# Patient Record
Sex: Male | Born: 1955
Health system: Southern US, Community
[De-identification: ages and names within clinical notes are randomized; demographics above are authoritative.]

## PROBLEM LIST (undated history)

## (undated) DIAGNOSIS — M1990 Unspecified osteoarthritis, unspecified site: Secondary | ICD-10-CM

## (undated) HISTORY — DX: Unspecified osteoarthritis, unspecified site: M19.90

---
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
## 1982-07-07 HISTORY — PX: ANKLE SURGERY: SHX546

## 1993-06-06 DIAGNOSIS — S3992XD Unspecified injury of lower back, subsequent encounter: Secondary | ICD-10-CM | POA: Insufficient documentation

## 1997-10-18 ENCOUNTER — Other Ambulatory Visit: Admission: RE | Admit: 1997-10-18 | Discharge: 1997-10-18 | Payer: Self-pay | Admitting: Dermatology

## 1999-07-04 ENCOUNTER — Ambulatory Visit (HOSPITAL_COMMUNITY): Admission: RE | Admit: 1999-07-04 | Discharge: 1999-07-04 | Payer: Self-pay | Admitting: Surgery

## 2000-06-14 ENCOUNTER — Ambulatory Visit (HOSPITAL_BASED_OUTPATIENT_CLINIC_OR_DEPARTMENT_OTHER): Admission: RE | Admit: 2000-06-14 | Discharge: 2000-06-14 | Payer: Self-pay | Admitting: Otolaryngology

## 2003-04-21 ENCOUNTER — Ambulatory Visit (HOSPITAL_COMMUNITY): Admission: RE | Admit: 2003-04-21 | Discharge: 2003-04-21 | Payer: Self-pay | Admitting: Gastroenterology

## 2016-12-22 DIAGNOSIS — R972 Elevated prostate specific antigen [PSA]: Secondary | ICD-10-CM | POA: Diagnosis not present

## 2016-12-22 DIAGNOSIS — Z0000 Encounter for general adult medical examination without abnormal findings: Secondary | ICD-10-CM | POA: Diagnosis not present

## 2016-12-22 DIAGNOSIS — Z8639 Personal history of other endocrine, nutritional and metabolic disease: Secondary | ICD-10-CM | POA: Diagnosis not present

## 2017-01-21 DIAGNOSIS — K648 Other hemorrhoids: Secondary | ICD-10-CM | POA: Diagnosis not present

## 2017-01-22 DIAGNOSIS — K648 Other hemorrhoids: Secondary | ICD-10-CM | POA: Insufficient documentation

## 2017-02-11 DIAGNOSIS — K648 Other hemorrhoids: Secondary | ICD-10-CM | POA: Diagnosis not present

## 2017-02-12 DIAGNOSIS — M25552 Pain in left hip: Secondary | ICD-10-CM | POA: Diagnosis not present

## 2017-02-19 DIAGNOSIS — M25552 Pain in left hip: Secondary | ICD-10-CM | POA: Diagnosis not present

## 2017-02-26 DIAGNOSIS — M25552 Pain in left hip: Secondary | ICD-10-CM | POA: Diagnosis not present

## 2017-02-26 DIAGNOSIS — S73192A Other sprain of left hip, initial encounter: Secondary | ICD-10-CM | POA: Diagnosis not present

## 2017-03-11 DIAGNOSIS — K648 Other hemorrhoids: Secondary | ICD-10-CM | POA: Diagnosis not present

## 2017-03-26 DIAGNOSIS — H40013 Open angle with borderline findings, low risk, bilateral: Secondary | ICD-10-CM | POA: Diagnosis not present

## 2017-03-31 DIAGNOSIS — M76892 Other specified enthesopathies of left lower limb, excluding foot: Secondary | ICD-10-CM | POA: Diagnosis not present

## 2017-03-31 DIAGNOSIS — M25552 Pain in left hip: Secondary | ICD-10-CM | POA: Diagnosis not present

## 2017-04-01 DIAGNOSIS — Z23 Encounter for immunization: Secondary | ICD-10-CM | POA: Diagnosis not present

## 2017-04-09 DIAGNOSIS — S73192D Other sprain of left hip, subsequent encounter: Secondary | ICD-10-CM | POA: Diagnosis not present

## 2017-04-20 DIAGNOSIS — S73192D Other sprain of left hip, subsequent encounter: Secondary | ICD-10-CM | POA: Diagnosis not present

## 2017-04-22 DIAGNOSIS — S73192D Other sprain of left hip, subsequent encounter: Secondary | ICD-10-CM | POA: Diagnosis not present

## 2017-04-27 DIAGNOSIS — S73192D Other sprain of left hip, subsequent encounter: Secondary | ICD-10-CM | POA: Diagnosis not present

## 2017-04-29 DIAGNOSIS — S73192D Other sprain of left hip, subsequent encounter: Secondary | ICD-10-CM | POA: Diagnosis not present

## 2017-05-04 DIAGNOSIS — S73192D Other sprain of left hip, subsequent encounter: Secondary | ICD-10-CM | POA: Diagnosis not present

## 2017-05-07 DIAGNOSIS — S73192D Other sprain of left hip, subsequent encounter: Secondary | ICD-10-CM | POA: Diagnosis not present

## 2017-05-12 DIAGNOSIS — S73192D Other sprain of left hip, subsequent encounter: Secondary | ICD-10-CM | POA: Diagnosis not present

## 2017-05-14 DIAGNOSIS — S73192D Other sprain of left hip, subsequent encounter: Secondary | ICD-10-CM | POA: Diagnosis not present

## 2017-05-15 DIAGNOSIS — M76892 Other specified enthesopathies of left lower limb, excluding foot: Secondary | ICD-10-CM | POA: Diagnosis not present

## 2017-05-20 DIAGNOSIS — S73192D Other sprain of left hip, subsequent encounter: Secondary | ICD-10-CM | POA: Diagnosis not present

## 2017-05-22 DIAGNOSIS — S73192D Other sprain of left hip, subsequent encounter: Secondary | ICD-10-CM | POA: Diagnosis not present

## 2017-05-26 DIAGNOSIS — D225 Melanocytic nevi of trunk: Secondary | ICD-10-CM | POA: Diagnosis not present

## 2017-05-26 DIAGNOSIS — L918 Other hypertrophic disorders of the skin: Secondary | ICD-10-CM | POA: Diagnosis not present

## 2017-05-26 DIAGNOSIS — L821 Other seborrheic keratosis: Secondary | ICD-10-CM | POA: Diagnosis not present

## 2017-05-26 DIAGNOSIS — D1801 Hemangioma of skin and subcutaneous tissue: Secondary | ICD-10-CM | POA: Diagnosis not present

## 2017-06-03 DIAGNOSIS — S73192D Other sprain of left hip, subsequent encounter: Secondary | ICD-10-CM | POA: Diagnosis not present

## 2017-06-05 DIAGNOSIS — S73192D Other sprain of left hip, subsequent encounter: Secondary | ICD-10-CM | POA: Diagnosis not present

## 2017-06-08 DIAGNOSIS — S73192D Other sprain of left hip, subsequent encounter: Secondary | ICD-10-CM | POA: Diagnosis not present

## 2017-06-10 DIAGNOSIS — S73192D Other sprain of left hip, subsequent encounter: Secondary | ICD-10-CM | POA: Diagnosis not present

## 2017-06-17 DIAGNOSIS — S73192D Other sprain of left hip, subsequent encounter: Secondary | ICD-10-CM | POA: Diagnosis not present

## 2017-06-22 DIAGNOSIS — S73192D Other sprain of left hip, subsequent encounter: Secondary | ICD-10-CM | POA: Diagnosis not present

## 2017-06-23 DIAGNOSIS — K648 Other hemorrhoids: Secondary | ICD-10-CM | POA: Diagnosis not present

## 2017-06-25 DIAGNOSIS — S73192D Other sprain of left hip, subsequent encounter: Secondary | ICD-10-CM | POA: Diagnosis not present

## 2017-07-06 DIAGNOSIS — S73192D Other sprain of left hip, subsequent encounter: Secondary | ICD-10-CM | POA: Diagnosis not present

## 2017-11-09 DIAGNOSIS — M5430 Sciatica, unspecified side: Secondary | ICD-10-CM | POA: Diagnosis not present

## 2017-11-11 DIAGNOSIS — M5136 Other intervertebral disc degeneration, lumbar region: Secondary | ICD-10-CM | POA: Diagnosis not present

## 2017-11-11 DIAGNOSIS — M9903 Segmental and somatic dysfunction of lumbar region: Secondary | ICD-10-CM | POA: Diagnosis not present

## 2017-11-11 DIAGNOSIS — M9905 Segmental and somatic dysfunction of pelvic region: Secondary | ICD-10-CM | POA: Diagnosis not present

## 2017-11-12 DIAGNOSIS — M9903 Segmental and somatic dysfunction of lumbar region: Secondary | ICD-10-CM | POA: Diagnosis not present

## 2017-11-12 DIAGNOSIS — M5136 Other intervertebral disc degeneration, lumbar region: Secondary | ICD-10-CM | POA: Diagnosis not present

## 2017-11-12 DIAGNOSIS — M9905 Segmental and somatic dysfunction of pelvic region: Secondary | ICD-10-CM | POA: Diagnosis not present

## 2017-11-16 DIAGNOSIS — M9903 Segmental and somatic dysfunction of lumbar region: Secondary | ICD-10-CM | POA: Diagnosis not present

## 2017-11-16 DIAGNOSIS — M9905 Segmental and somatic dysfunction of pelvic region: Secondary | ICD-10-CM | POA: Diagnosis not present

## 2017-11-16 DIAGNOSIS — M5136 Other intervertebral disc degeneration, lumbar region: Secondary | ICD-10-CM | POA: Diagnosis not present

## 2017-11-18 DIAGNOSIS — M9905 Segmental and somatic dysfunction of pelvic region: Secondary | ICD-10-CM | POA: Diagnosis not present

## 2017-11-18 DIAGNOSIS — M5136 Other intervertebral disc degeneration, lumbar region: Secondary | ICD-10-CM | POA: Diagnosis not present

## 2017-11-18 DIAGNOSIS — M9903 Segmental and somatic dysfunction of lumbar region: Secondary | ICD-10-CM | POA: Diagnosis not present

## 2017-11-19 DIAGNOSIS — M5136 Other intervertebral disc degeneration, lumbar region: Secondary | ICD-10-CM | POA: Diagnosis not present

## 2017-11-19 DIAGNOSIS — M9905 Segmental and somatic dysfunction of pelvic region: Secondary | ICD-10-CM | POA: Diagnosis not present

## 2017-11-19 DIAGNOSIS — M9903 Segmental and somatic dysfunction of lumbar region: Secondary | ICD-10-CM | POA: Diagnosis not present

## 2017-11-23 DIAGNOSIS — M9903 Segmental and somatic dysfunction of lumbar region: Secondary | ICD-10-CM | POA: Diagnosis not present

## 2017-11-23 DIAGNOSIS — M5136 Other intervertebral disc degeneration, lumbar region: Secondary | ICD-10-CM | POA: Diagnosis not present

## 2017-11-23 DIAGNOSIS — M9905 Segmental and somatic dysfunction of pelvic region: Secondary | ICD-10-CM | POA: Diagnosis not present

## 2017-11-24 DIAGNOSIS — M9905 Segmental and somatic dysfunction of pelvic region: Secondary | ICD-10-CM | POA: Diagnosis not present

## 2017-11-24 DIAGNOSIS — M5136 Other intervertebral disc degeneration, lumbar region: Secondary | ICD-10-CM | POA: Diagnosis not present

## 2017-11-24 DIAGNOSIS — M9903 Segmental and somatic dysfunction of lumbar region: Secondary | ICD-10-CM | POA: Diagnosis not present

## 2017-11-26 DIAGNOSIS — M9905 Segmental and somatic dysfunction of pelvic region: Secondary | ICD-10-CM | POA: Diagnosis not present

## 2017-11-26 DIAGNOSIS — M9903 Segmental and somatic dysfunction of lumbar region: Secondary | ICD-10-CM | POA: Diagnosis not present

## 2017-11-26 DIAGNOSIS — M5136 Other intervertebral disc degeneration, lumbar region: Secondary | ICD-10-CM | POA: Diagnosis not present

## 2017-12-08 DIAGNOSIS — M9905 Segmental and somatic dysfunction of pelvic region: Secondary | ICD-10-CM | POA: Diagnosis not present

## 2017-12-08 DIAGNOSIS — M76892 Other specified enthesopathies of left lower limb, excluding foot: Secondary | ICD-10-CM | POA: Diagnosis not present

## 2017-12-08 DIAGNOSIS — M5136 Other intervertebral disc degeneration, lumbar region: Secondary | ICD-10-CM | POA: Diagnosis not present

## 2017-12-08 DIAGNOSIS — M47816 Spondylosis without myelopathy or radiculopathy, lumbar region: Secondary | ICD-10-CM | POA: Diagnosis not present

## 2017-12-08 DIAGNOSIS — M9903 Segmental and somatic dysfunction of lumbar region: Secondary | ICD-10-CM | POA: Diagnosis not present

## 2017-12-08 DIAGNOSIS — M1612 Unilateral primary osteoarthritis, left hip: Secondary | ICD-10-CM | POA: Diagnosis not present

## 2017-12-10 DIAGNOSIS — M5136 Other intervertebral disc degeneration, lumbar region: Secondary | ICD-10-CM | POA: Diagnosis not present

## 2017-12-10 DIAGNOSIS — M9905 Segmental and somatic dysfunction of pelvic region: Secondary | ICD-10-CM | POA: Diagnosis not present

## 2017-12-10 DIAGNOSIS — M9903 Segmental and somatic dysfunction of lumbar region: Secondary | ICD-10-CM | POA: Diagnosis not present

## 2017-12-14 DIAGNOSIS — M9905 Segmental and somatic dysfunction of pelvic region: Secondary | ICD-10-CM | POA: Diagnosis not present

## 2017-12-14 DIAGNOSIS — M9903 Segmental and somatic dysfunction of lumbar region: Secondary | ICD-10-CM | POA: Diagnosis not present

## 2017-12-14 DIAGNOSIS — M5136 Other intervertebral disc degeneration, lumbar region: Secondary | ICD-10-CM | POA: Diagnosis not present

## 2017-12-17 DIAGNOSIS — M5136 Other intervertebral disc degeneration, lumbar region: Secondary | ICD-10-CM | POA: Diagnosis not present

## 2017-12-17 DIAGNOSIS — M9905 Segmental and somatic dysfunction of pelvic region: Secondary | ICD-10-CM | POA: Diagnosis not present

## 2017-12-17 DIAGNOSIS — M9903 Segmental and somatic dysfunction of lumbar region: Secondary | ICD-10-CM | POA: Diagnosis not present

## 2017-12-21 DIAGNOSIS — M76892 Other specified enthesopathies of left lower limb, excluding foot: Secondary | ICD-10-CM | POA: Diagnosis not present

## 2017-12-21 DIAGNOSIS — M5136 Other intervertebral disc degeneration, lumbar region: Secondary | ICD-10-CM | POA: Diagnosis not present

## 2017-12-21 DIAGNOSIS — M9905 Segmental and somatic dysfunction of pelvic region: Secondary | ICD-10-CM | POA: Diagnosis not present

## 2017-12-21 DIAGNOSIS — M9903 Segmental and somatic dysfunction of lumbar region: Secondary | ICD-10-CM | POA: Diagnosis not present

## 2017-12-25 DIAGNOSIS — R972 Elevated prostate specific antigen [PSA]: Secondary | ICD-10-CM | POA: Diagnosis not present

## 2017-12-25 DIAGNOSIS — Z8639 Personal history of other endocrine, nutritional and metabolic disease: Secondary | ICD-10-CM | POA: Diagnosis not present

## 2017-12-25 DIAGNOSIS — E7849 Other hyperlipidemia: Secondary | ICD-10-CM | POA: Diagnosis not present

## 2017-12-28 DIAGNOSIS — M9905 Segmental and somatic dysfunction of pelvic region: Secondary | ICD-10-CM | POA: Diagnosis not present

## 2017-12-28 DIAGNOSIS — M545 Low back pain: Secondary | ICD-10-CM | POA: Diagnosis not present

## 2017-12-28 DIAGNOSIS — Z23 Encounter for immunization: Secondary | ICD-10-CM | POA: Diagnosis not present

## 2017-12-28 DIAGNOSIS — Z0000 Encounter for general adult medical examination without abnormal findings: Secondary | ICD-10-CM | POA: Diagnosis not present

## 2017-12-28 DIAGNOSIS — M5136 Other intervertebral disc degeneration, lumbar region: Secondary | ICD-10-CM | POA: Diagnosis not present

## 2017-12-28 DIAGNOSIS — H9313 Tinnitus, bilateral: Secondary | ICD-10-CM | POA: Diagnosis not present

## 2017-12-28 DIAGNOSIS — M9903 Segmental and somatic dysfunction of lumbar region: Secondary | ICD-10-CM | POA: Diagnosis not present

## 2017-12-29 DIAGNOSIS — M76892 Other specified enthesopathies of left lower limb, excluding foot: Secondary | ICD-10-CM | POA: Diagnosis not present

## 2017-12-31 DIAGNOSIS — M76892 Other specified enthesopathies of left lower limb, excluding foot: Secondary | ICD-10-CM | POA: Diagnosis not present

## 2018-01-05 DIAGNOSIS — M76892 Other specified enthesopathies of left lower limb, excluding foot: Secondary | ICD-10-CM | POA: Diagnosis not present

## 2018-01-11 DIAGNOSIS — M76892 Other specified enthesopathies of left lower limb, excluding foot: Secondary | ICD-10-CM | POA: Diagnosis not present

## 2018-01-18 DIAGNOSIS — M5136 Other intervertebral disc degeneration, lumbar region: Secondary | ICD-10-CM | POA: Diagnosis not present

## 2018-01-18 DIAGNOSIS — M9905 Segmental and somatic dysfunction of pelvic region: Secondary | ICD-10-CM | POA: Diagnosis not present

## 2018-01-18 DIAGNOSIS — M9903 Segmental and somatic dysfunction of lumbar region: Secondary | ICD-10-CM | POA: Diagnosis not present

## 2018-01-26 DIAGNOSIS — M47816 Spondylosis without myelopathy or radiculopathy, lumbar region: Secondary | ICD-10-CM | POA: Diagnosis not present

## 2018-03-31 DIAGNOSIS — Z23 Encounter for immunization: Secondary | ICD-10-CM | POA: Diagnosis not present

## 2018-04-08 DIAGNOSIS — H40013 Open angle with borderline findings, low risk, bilateral: Secondary | ICD-10-CM | POA: Diagnosis not present

## 2018-11-05 HISTORY — PX: COLONOSCOPY: SHX174

## 2018-11-11 DIAGNOSIS — Z8371 Family history of colonic polyps: Secondary | ICD-10-CM | POA: Diagnosis not present

## 2018-11-11 DIAGNOSIS — K5010 Crohn's disease of large intestine without complications: Secondary | ICD-10-CM | POA: Diagnosis not present

## 2018-11-11 DIAGNOSIS — K6389 Other specified diseases of intestine: Secondary | ICD-10-CM | POA: Diagnosis not present

## 2018-11-11 DIAGNOSIS — Z1211 Encounter for screening for malignant neoplasm of colon: Secondary | ICD-10-CM | POA: Diagnosis not present

## 2018-11-11 DIAGNOSIS — K5730 Diverticulosis of large intestine without perforation or abscess without bleeding: Secondary | ICD-10-CM | POA: Diagnosis not present

## 2018-11-11 DIAGNOSIS — K5790 Diverticulosis of intestine, part unspecified, without perforation or abscess without bleeding: Secondary | ICD-10-CM | POA: Insufficient documentation

## 2020-02-24 ENCOUNTER — Other Ambulatory Visit: Payer: Self-pay | Admitting: Sleep Medicine

## 2020-02-24 ENCOUNTER — Other Ambulatory Visit: Payer: Self-pay

## 2020-02-24 DIAGNOSIS — Z20822 Contact with and (suspected) exposure to covid-19: Secondary | ICD-10-CM

## 2020-02-25 LAB — SARS-COV-2, NAA 2 DAY TAT

## 2020-02-25 LAB — NOVEL CORONAVIRUS, NAA: SARS-CoV-2, NAA: NOT DETECTED

## 2020-05-29 ENCOUNTER — Ambulatory Visit (INDEPENDENT_AMBULATORY_CARE_PROVIDER_SITE_OTHER): Payer: 59 | Admitting: Podiatry

## 2020-05-29 ENCOUNTER — Encounter: Payer: Self-pay | Admitting: Podiatry

## 2020-05-29 ENCOUNTER — Ambulatory Visit: Payer: 59

## 2020-05-29 ENCOUNTER — Other Ambulatory Visit: Payer: Self-pay | Admitting: Podiatry

## 2020-05-29 ENCOUNTER — Other Ambulatory Visit: Payer: Self-pay

## 2020-05-29 ENCOUNTER — Ambulatory Visit (INDEPENDENT_AMBULATORY_CARE_PROVIDER_SITE_OTHER): Payer: 59

## 2020-05-29 DIAGNOSIS — M722 Plantar fascial fibromatosis: Secondary | ICD-10-CM

## 2020-05-29 DIAGNOSIS — S9031XA Contusion of right foot, initial encounter: Secondary | ICD-10-CM | POA: Diagnosis not present

## 2020-05-29 MED ORDER — TRIAMCINOLONE ACETONIDE 40 MG/ML IJ SUSP
20.0000 mg | Freq: Once | INTRAMUSCULAR | Status: AC
  Administered 2020-05-29: 20 mg

## 2020-05-29 MED ORDER — METHYLPREDNISOLONE 4 MG PO TBPK: ORAL_TABLET | ORAL | 0 refills | Status: DC

## 2020-05-29 MED ORDER — MELOXICAM 15 MG PO TABS: 15.0000 mg | ORAL_TABLET | Freq: Every day | ORAL | 3 refills | Status: DC

## 2020-05-29 NOTE — Patient Instructions (Signed)

## 2020-05-29 NOTE — Progress Notes (Signed)
°  Subjective:  Patient ID: Jimmy Stewart, male    DOB: 1955/07/25,  MRN: [id number] HPI Chief Complaint  Patient presents with   Foot Injury    Dorsal/lateral foot right - rolled ankle in Feb 2021, went to Ortho-xrayed-said hairline fracture, wore brace for 6 weeks-helped, now over the last 2-3 months had radiating pain through dorsal foot, tried brace again, but still sore   New Patient (Initial Visit)    64 y.o. male presents with the above complaint.   ROS: Denies fever chills nausea vomiting muscle aches pains calf pain back pain chest pain shortness of breath.  No past medical history on file.   Current Outpatient Medications:    HYDROcodone-Acetaminophen 2.5-325 MG TABS, hydrocodone 2.5 mg-acetaminophen 325 mg tablet  Take 1 tablet as needed by oral route as needed., Disp: , Rfl:    tadalafil (CIALIS) 5 MG tablet, , Disp: , Rfl:    ketoconazole (NIZORAL) 2 % cream, ketoconazole 2 % topical cream  1 APPLICATION APPLY ON THE SKIN AS DIRECTED APPLY TO FACE DAILY, Disp: , Rfl:    meloxicam (MOBIC) 15 MG tablet, Take 1 tablet (15 mg total) by mouth daily., Disp: 30 tablet, Rfl: 3   methylPREDNISolone (MEDROL DOSEPAK) 4 MG TBPK tablet, 6 day dose pack - take as directed, Disp: 21 tablet, Rfl: 0  No Known Allergies Review of Systems Objective:  There were no vitals filed for this visit.  General: Well developed, nourished, in no acute distress, alert and oriented x3   Dermatological: Skin is warm, dry and supple bilateral. Nails x 10 are well maintained; remaining integument appears unremarkable at this time. There are no open sores, no preulcerative lesions, no rash or signs of infection present.  Vascular: Dorsalis Pedis artery and Posterior Tibial artery pedal pulses are 2/4 bilateral with immedate capillary fill time. Pedal hair growth present. No varicosities and no lower extremity edema present bilateral.   Neruologic: Grossly intact via light touch bilateral.  Vibratory intact via tuning fork bilateral. Protective threshold with Semmes Wienstein monofilament intact to all pedal sites bilateral. Patellar and Achilles deep tendon reflexes 2+ bilateral. No Babinski or clonus noted bilateral.   Musculoskeletal: No gross boney pedal deformities bilateral. No pain, crepitus, or limitation noted with foot and ankle range of motion bilateral. Muscular strength 5/5 in all groups tested bilateral.  Pain on palpation medial calcaneal tubercle of the right heel no pain on medial lateral compression of the calcaneus.  No pain on range of motion of the ankle joint.  Gait: Unassisted, Nonantalgic.    Radiographs:  Radiographs osseous mature individual demonstrates 3 views of the foot and ankle demonstrating no fractures but soft tissue increase in density plantar fashion calcaneal insertion site.  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  Placed in L 1902.  Follow-up with him in 1 month discussed appropriate shoe gear stretching exercises and ice therapy.     Apolinar Bero T. [place], [place]

## 2020-06-28 ENCOUNTER — Encounter: Payer: Self-pay | Admitting: Podiatry

## 2020-06-28 ENCOUNTER — Ambulatory Visit (INDEPENDENT_AMBULATORY_CARE_PROVIDER_SITE_OTHER): Payer: 59 | Admitting: Podiatry

## 2020-06-28 ENCOUNTER — Other Ambulatory Visit: Payer: Self-pay

## 2020-06-28 DIAGNOSIS — M205X9 Other deformities of toe(s) (acquired), unspecified foot: Secondary | ICD-10-CM

## 2020-06-28 DIAGNOSIS — M722 Plantar fascial fibromatosis: Secondary | ICD-10-CM

## 2020-06-28 NOTE — Progress Notes (Signed)
He presents today for follow-up of his plantar fasciitis.  He states he has a little bit of pain along the lateral aspect of the foot as he points to the peroneals around the ankles.  Objective: Vital signs are stable he is alert oriented x3 he relates to me that he is 95% improved as far as the plan fasciitis and the dorsum of the foot pain goes.  The only tenderness he has is not a daily basis but is along this area as he pointed to the peroneal tendons.  On physical exam no pain on palpation medial located tubercle no pain across the dorsum of the foot he does have some tenderness on palpation of the peroneal tendons with some fluctuance around the peroneal tubercle as well as the inferior aspect of the fibula.  Assessment: Resolving plantar fasciitis and lateral compensatory syndrome.  Plan: At this point [facility] recommend he continue all conservative therapies including his meloxicam and I will follow-up with him on an as-needed basis.

## 2020-07-31 ENCOUNTER — Telehealth: Payer: Self-pay | Admitting: Podiatry

## 2020-07-31 DIAGNOSIS — M722 Plantar fascial fibromatosis: Secondary | ICD-10-CM | POA: Diagnosis not present

## 2020-07-31 NOTE — Telephone Encounter (Signed)
Pt left message stating he has missplaced the plantar fasciitis brace and would like to know if he can pick another one up..  I returned call and left message for pt ok to pick up and we will bill insurance.

## 2020-09-25 ENCOUNTER — Other Ambulatory Visit: Payer: Self-pay | Admitting: Podiatry

## 2020-11-13 ENCOUNTER — Ambulatory Visit (INDEPENDENT_AMBULATORY_CARE_PROVIDER_SITE_OTHER): Payer: 59 | Admitting: Podiatry

## 2020-11-13 ENCOUNTER — Other Ambulatory Visit: Payer: Self-pay

## 2020-11-13 DIAGNOSIS — M205X9 Other deformities of toe(s) (acquired), unspecified foot: Secondary | ICD-10-CM | POA: Diagnosis not present

## 2020-11-13 NOTE — Progress Notes (Signed)
He presents today for follow-up of his second toe of his right foot.  He states that the fasciitis seems to be doing okay but he would like to go ahead and see about getting the second toe to sit down just a little bit lower.  Objective: Vital signs stable he is alert and oriented x3.  Pulses are palpable.  He has a mild hammertoe deformity contracted at the level of the metatarsophalangeal joint and some rigid contraction at the PIPJ plantarly.  The dorsal contracture is minimal.  More significant plantar contracture is noted.  Assessment: Hammertoe deformity with dorsal contraction at the metatarsophalangeal joint.  Plan: At this point we consented him today for an extensor tenotomy to allow the toe to lay down.  He understands this and is amenable to it.  I did explain to him that we may end up having to do a flexor tenotomy or even going to surgery for fusion.  He understands this and is amenable to it if necessary.  Local anesthetic static was administered prepped and draped in normal sterile fashion single extensor tendon was tenotomized in length and area was flushed with normal sterile saline dried and then Dermabond was placed.  Allowed to dry and then placed a dry sterile compressive dressing splinting the toe in appropriate position placed in a Darco shoe expressed to him how important was for him to keep the dressing on so that the tendon and tendon sheath can begin to heal in his new position he understands and is amenable to it I will follow-up with him in 1 week for redress.

## 2020-11-13 NOTE — Patient Instructions (Signed)
Leave bandage in place and dry for 3-4 days, then remove. You may wash foot normally after removal of bandage. DO NOT SOAK FOOT! Dry completely afterwards and may use a bandaid over incision if needed. We will follow up with you in 2 weeks for recheck. 

## 2020-11-22 ENCOUNTER — Other Ambulatory Visit: Payer: Self-pay

## 2020-11-22 ENCOUNTER — Ambulatory Visit (INDEPENDENT_AMBULATORY_CARE_PROVIDER_SITE_OTHER): Payer: 59 | Admitting: Podiatry

## 2020-11-22 ENCOUNTER — Encounter: Payer: Self-pay | Admitting: Podiatry

## 2020-11-22 DIAGNOSIS — M205X9 Other deformities of toe(s) (acquired), unspecified foot: Secondary | ICD-10-CM

## 2020-11-22 DIAGNOSIS — Z9889 Other specified postprocedural states: Secondary | ICD-10-CM

## 2020-11-25 NOTE — Progress Notes (Signed)
No bilateral hemicolon he presents today for follow-up of his tenotomy second digit right foot.  States that he still hammered down and seems to be a little more aggressive at plantarflexion than it was previously states that the toe is still somewhat elevated.  Objective: Vital signs are stable he is alert oriented x3 proximal phalanx is completely rectus.  He does have arthritis in plantar flexion at the level of the PIPJ of the second digit.  This is not underlay down flat.  He understands and is amenable to it.  Assessment: Well-healing extensor tenotomy.  May need to consider flexor tenotomy in a few weeks down the road, and will place him in a per active digital compression device.  Plan: Placed him in a hammertoe corrective device and scheduled for surgery in office.

## 2020-12-11 ENCOUNTER — Other Ambulatory Visit: Payer: Self-pay

## 2020-12-11 ENCOUNTER — Ambulatory Visit (INDEPENDENT_AMBULATORY_CARE_PROVIDER_SITE_OTHER): Payer: 59 | Admitting: Podiatry

## 2020-12-11 ENCOUNTER — Encounter: Payer: Self-pay | Admitting: Podiatry

## 2020-12-11 DIAGNOSIS — M205X9 Other deformities of toe(s) (acquired), unspecified foot: Secondary | ICD-10-CM

## 2020-12-11 DIAGNOSIS — M205X1 Other deformities of toe(s) (acquired), right foot: Secondary | ICD-10-CM

## 2020-12-11 NOTE — Progress Notes (Signed)
He presents today for a flexor tenotomy second digit of the right foot.  States that he tolerated extensor tenotomy very well and he would like for this toe to straighten out.  Objective: Vital signs are stable he is alert oriented x3 he has a flexor contraction at the PIPJ of the second digit right foot with osteoarthritic changes.  Assessment contracted second digit right foot.  Plan: I went ahead and injected around the joint today with 50 diminished Marcaine plain lidocaine with epinephrine.  Also prepped and draped in the normal sterile fashion made a small stab incision with a #11 blade at the plantar surface of the PIPJ transecting the long flexor tendon.  The toe leg rectus.  I placed a small amount of Dermabond on the incision site.  And then dressed with a dry sterile compressive dressing.  He is to wear his Darco shoe I will follow-up with him in 1 week.

## 2020-12-11 NOTE — Patient Instructions (Signed)
Leave bandage in place and dry for 4 days, then remove. You may wash foot normally after removal of bandage. DO NOT SOAK FOOT! Dry completely afterwards and may use a bandaid over incision if needed. We will follow up with you in 1 weeks for recheck.  

## 2020-12-20 ENCOUNTER — Other Ambulatory Visit: Payer: Self-pay

## 2020-12-20 ENCOUNTER — Ambulatory Visit (INDEPENDENT_AMBULATORY_CARE_PROVIDER_SITE_OTHER): Payer: 59 | Admitting: Podiatry

## 2020-12-20 ENCOUNTER — Encounter: Payer: Self-pay | Admitting: Podiatry

## 2020-12-20 DIAGNOSIS — Z9889 Other specified postprocedural states: Secondary | ICD-10-CM

## 2020-12-20 DIAGNOSIS — M205X9 Other deformities of toe(s) (acquired), unspecified foot: Secondary | ICD-10-CM

## 2020-12-22 NOTE — Progress Notes (Signed)
He presents today for follow-up of his flexor tenotomy second digit of the right foot.  States that he is very happy with the outcome.  There is no problems with that at all.  I states that is still little bit sore but it looks a whole lot straighter and Ahola better than it did previously.  He thinks that this is going to do the trick and hopefully we will not have to go to the operating room.  Objective: Vital signs are stable he is alert oriented x3 the toe appears to be sitting rectus there is no signs of infection the small incision site has gone on to heal uneventfully with the Dermabond.  Assessment well-healing extensor and flexor tenotomy.  Plan: Discussed etiology pathology conservative therapies at this point he is on follow-up with me on an as-needed basis.

## 2021-01-30 ENCOUNTER — Other Ambulatory Visit: Payer: Self-pay | Admitting: Podiatry

## 2021-03-12 ENCOUNTER — Other Ambulatory Visit: Payer: Self-pay | Admitting: Urology

## 2021-03-12 DIAGNOSIS — R972 Elevated prostate specific antigen [PSA]: Secondary | ICD-10-CM

## 2021-04-02 ENCOUNTER — Other Ambulatory Visit: Payer: 59

## 2021-04-18 ENCOUNTER — Ambulatory Visit
Admission: RE | Admit: 2021-04-18 | Discharge: 2021-04-18 | Disposition: A | Discharge status: Home / Self Care | Payer: 59 | Source: Ambulatory Visit | Attending: Urology | Admitting: Urology

## 2021-04-18 ENCOUNTER — Other Ambulatory Visit: Payer: Self-pay

## 2021-04-18 DIAGNOSIS — R972 Elevated prostate specific antigen [PSA]: Secondary | ICD-10-CM

## 2021-04-18 IMAGING — MR MR PROSTATE WO/W CM
12 series · 48 of 48 positions shown · IV contrast (multihance)
Comparison: None.

CLINICAL DATA: Elevated PSA of 5.1.  No prior biopsy.

EXAM:
MR PROSTATE WITHOUT AND WITH CONTRAST
TECHNIQUE: Multiplanar multisequence MRI images were obtained of the pelvis
centered about the prostate. Pre and post contrast images were
obtained.
CONTRAST:  20mL MULTIHANCE GADOBENATE DIMEGLUMINE 529 MG/ML IV SOLN

[Series 3: T2 · coronal · 3.0mm · 0.56mm/px · 1 of 24 slices shown (1 of 3)]
[im 1/24]
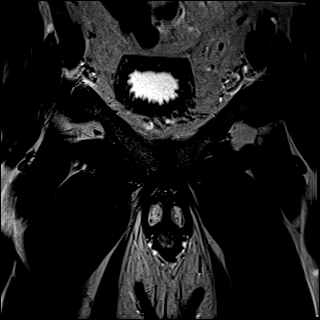

[Series 4: T1 · axial · 5.0mm · 1.25mm/px · 1 of 80 slices shown]
[im 1/80]
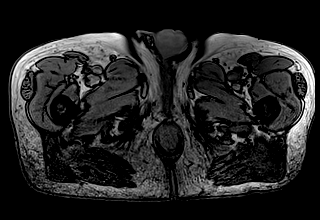

[Series 5: DWI · axial · 3.0mm · 1.93mm/px · z∈[+24,+96]mm · 2 of 74 slices shown (1 of 3)]
[im 1/74]
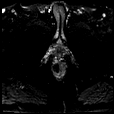
[im 74/74]
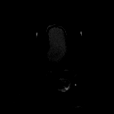

[Series 6: DWI · axial · 3.0mm · 1.93mm/px · 1 of 25 slices shown (2 of 3)]
[im 1/25]
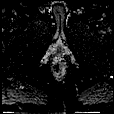

[Series 7: DWI · axial · 3.0mm · 1.93mm/px · 1 of 25 slices shown (3 of 3)]
[im 1/25]
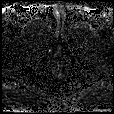

[Series 10: T2 · axial · 3.0mm · 0.56mm/px · 1 of 25 slices shown (2 of 3)]
[im 1/25]
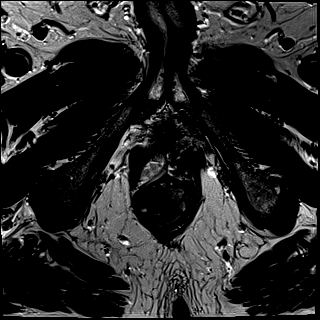

[Series 11: T2 · axial · 1.0mm · 1.04mm/px · z∈[+29,+100]mm · 2 of 72 slices shown (3 of 3)]
[im 1/72]
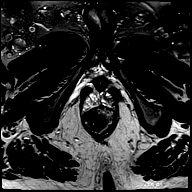
[im 72/72]
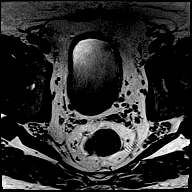

[Series 12: pre t1_twist_tra_dyn · axial · non-contrast · 3.5mm · 0.99mm/px · 1 of 20 slices shown]
[im 1/20]
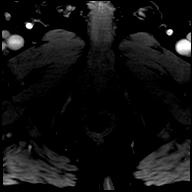

[Series 13: post t1_twist_tra_dyn-copy center · axial · non-contrast · 3.5mm · 0.99mm/px · z∈[+31,+97]mm · 17 of 600 slices shown]
[im 1/600]
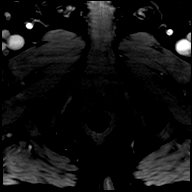
[im 38/600]
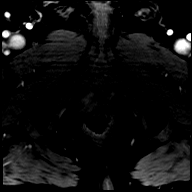
[im 75/600]
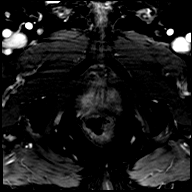
[im 113/600]
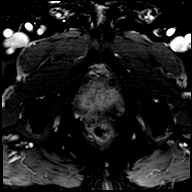
[im 150/600]
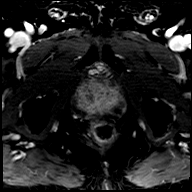
[im 188/600]
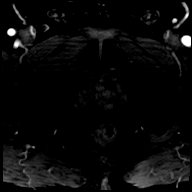
[im 225/600]
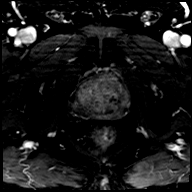
[im 263/600]
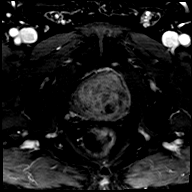
[im 300/600]
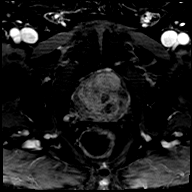
[im 337/600]
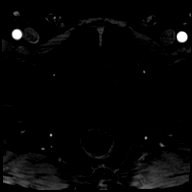
[im 375/600]
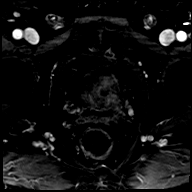
[im 412/600]
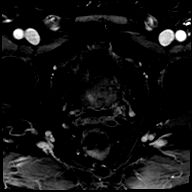
[im 450/600]
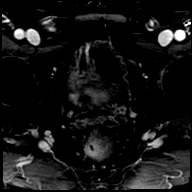
[im 487/600]
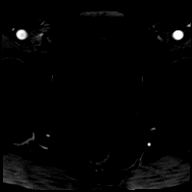
[im 525/600]
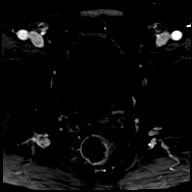
[im 562/600]
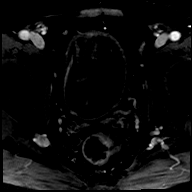
[im 600/600]
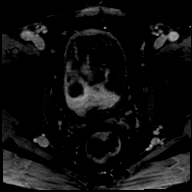

[Series 14: post t1_twist_tra_dyn-copy cent_sub · axial · 3.5mm · 0.99mm/px · z∈[+31,+97]mm · 17 of 580 slices shown]
[im 1/580]
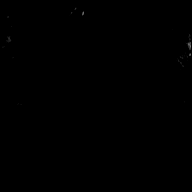
[im 37/580]
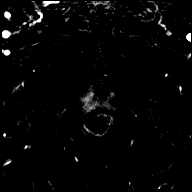
[im 73/580]
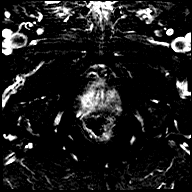
[im 109/580]
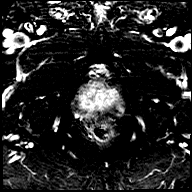
[im 145/580]
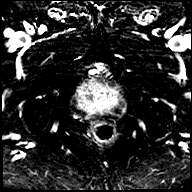
[im 181/580]
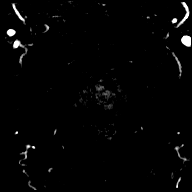
[im 218/580]
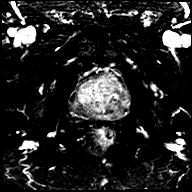
[im 254/580]
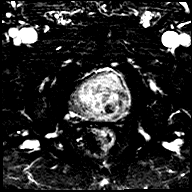
[im 290/580]
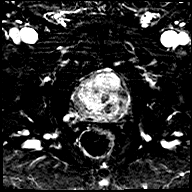
[im 326/580]
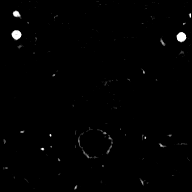
[im 362/580]
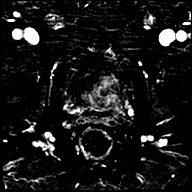
[im 399/580]
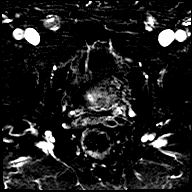
[im 435/580]
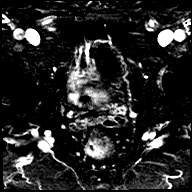
[im 471/580]
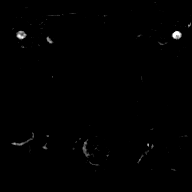
[im 507/580]
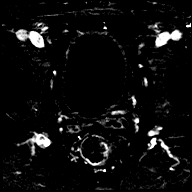
[im 543/580]
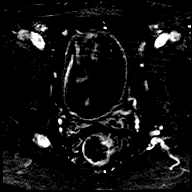
[im 580/580]
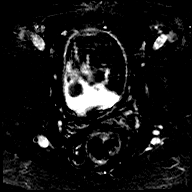

[Series 15: t1_vibe_dixon_tra_f · axial · 2.5mm · 0.91mm/px · z∈[+1,+198]mm · 2 of 80 slices shown]
[im 1/80]
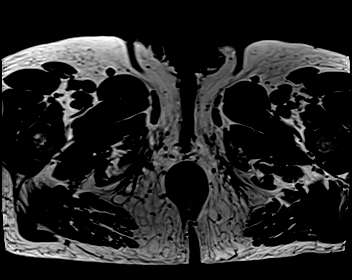
[im 80/80]
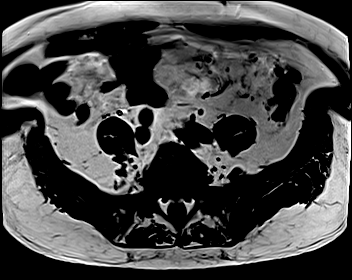

[Series 16: t1_vibe_dixon_tra_w · axial · 2.5mm · 0.91mm/px · z∈[+1,+198]mm · 2 of 80 slices shown]
[im 1/80]
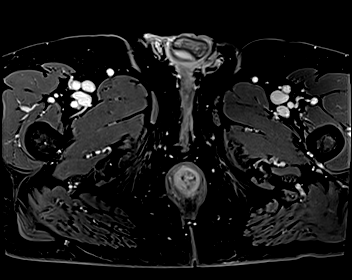
[im 80/80]
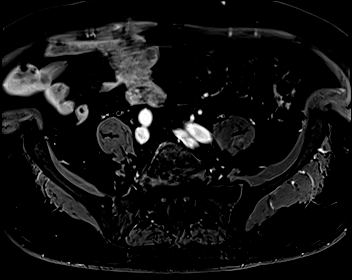

[48 of 48 positions shown; findings below may reference images not displayed]

FINDINGS: Prostate: Moderate central gland enlargement and heterogeneity,
consistent with benign prostatic hyperplasia. No dominant central
gland nodule. Apparent T2 hypointensity and decreased signal on ADC
map within the lateral left mid to apical gland, including at 1.8 x
1.3 cm on [DATE] and [DATE] is favored to represent a extruded BPH
nodule. Correlate early post-contrast enhancement, including on
175/13. PI-RADS(v2.1)-3.

Volume: 5.2 x 5.9 x 4.7 cm (volume = 76 cm^3)

Transcapsular spread:  Absent

Seminal vesicle involvement: Absent

Neurovascular bundle involvement: Absent

Pelvic adenopathy: Absent

Bone metastasis: Absent

Other findings: Probable L5 pars defects. Normal urinary bladder. No
significant free fluid. Scattered colonic diverticula.
IMPRESSION: 1. Multiparametric signal abnormality within the lateral left mid to
apical gland is favored to represent an extruded BPH nodule but
technically categorized as PI-RADS 3.
2. No findings of pelvic metastatic disease.

## 2021-04-18 MED ORDER — GADOBENATE DIMEGLUMINE 529 MG/ML IV SOLN
20.0000 mL | Freq: Once | INTRAVENOUS | Status: AC | PRN
  Administered 2021-04-18: 20 mL via INTRAVENOUS

## 2021-06-09 ENCOUNTER — Other Ambulatory Visit: Payer: Self-pay | Admitting: Podiatry

## 2021-07-09 ENCOUNTER — Other Ambulatory Visit: Payer: Self-pay

## 2021-07-09 ENCOUNTER — Other Ambulatory Visit: Payer: Self-pay | Admitting: Podiatry

## 2021-07-09 ENCOUNTER — Encounter: Payer: Self-pay | Admitting: Podiatry

## 2021-07-09 ENCOUNTER — Ambulatory Visit (INDEPENDENT_AMBULATORY_CARE_PROVIDER_SITE_OTHER): Payer: 59

## 2021-07-09 ENCOUNTER — Ambulatory Visit: Payer: 59 | Admitting: Podiatry

## 2021-07-09 DIAGNOSIS — M19079 Primary osteoarthritis, unspecified ankle and foot: Secondary | ICD-10-CM | POA: Diagnosis not present

## 2021-07-09 DIAGNOSIS — M7671 Peroneal tendinitis, right leg: Secondary | ICD-10-CM | POA: Diagnosis not present

## 2021-07-09 DIAGNOSIS — M19071 Primary osteoarthritis, right ankle and foot: Secondary | ICD-10-CM

## 2021-07-09 DIAGNOSIS — M722 Plantar fascial fibromatosis: Secondary | ICD-10-CM

## 2021-07-10 NOTE — Progress Notes (Signed)
He presents today chief complaint of sharp pain along the lateral side of his right foot.  He states that the toes are doing great no problems with them any longer and that he states that the fasciitis has resolved but he still has lateral pain from the base of the fourth and fifth metatarsal proximally to this area of where he points to the cuboid.  Objective: Vital signs are stable he is alert and oriented x3.  Pulses are palpable.  He has rectus toes no pain on palpation medial calcaneal tubercle plantar central calcaneal tubercle or lateral calcaneal tubercle.  He has pain on frontal plane range of motion with eversion overlying the fourth fifth TMT joint he also has pain on plantarflexion and eversion around the cuboid arcuate portion.  He also has pain on palpation of the peroneus longus in this area the area is swollen and tight and painful as is the fourth fifth TMT joint.  Radiographs taken today did not demonstrate any type of osseous abnormalities that appears to be a soft tissue void at the arcuate portion of the cuboid and on 1 view it does demonstrate some bone to bone contact of the fifth metatarsal cuboid articulation.  It also appears to have had an old fracture of the cuboid.  Nondisplaced not comminuted it appears to be healed  Assessment: Highly suspect osteoarthritis or some type of ligamentous injury in this area not visible on radiographs.  Also question as to whether or not he has a tear at the arcuate portion of the cuboid.  Plan: We will request an MRI to evaluate the lateral aspect of this midfoot proximally.  This would be for differential diagnosis and surgical planning.

## 2021-07-15 ENCOUNTER — Other Ambulatory Visit: Payer: Self-pay | Admitting: Family Medicine

## 2021-07-15 ENCOUNTER — Encounter: Payer: Self-pay | Admitting: *Deleted

## 2021-07-15 DIAGNOSIS — Z87891 Personal history of nicotine dependence: Secondary | ICD-10-CM

## 2021-07-15 DIAGNOSIS — Z136 Encounter for screening for cardiovascular disorders: Secondary | ICD-10-CM

## 2021-07-15 DIAGNOSIS — M19079 Primary osteoarthritis, unspecified ankle and foot: Secondary | ICD-10-CM

## 2021-07-15 NOTE — Telephone Encounter (Signed)
Please advise 

## 2021-07-16 ENCOUNTER — Other Ambulatory Visit: Payer: Self-pay

## 2021-07-16 ENCOUNTER — Ambulatory Visit
Admission: RE | Admit: 2021-07-16 | Discharge: 2021-07-16 | Disposition: A | Discharge status: Home / Self Care | Payer: 59 | Source: Ambulatory Visit | Attending: Podiatry | Admitting: Podiatry

## 2021-07-16 DIAGNOSIS — M19079 Primary osteoarthritis, unspecified ankle and foot: Secondary | ICD-10-CM

## 2021-07-16 DIAGNOSIS — M7671 Peroneal tendinitis, right leg: Secondary | ICD-10-CM

## 2021-07-16 IMAGING — MR MR ANKLE*R* W/O CM
5 series · 37 of 40 positions shown · non-contrast
Comparison: X-ray foot [DATE].

CLINICAL DATA: Chronic right ankle pain with pain on top/lateral
side of foot. History of plantar fascitis. Clinical concern for
osteoarthritis, peroneal tendonitis and tarsometatarsal joint
4th/5th.

EXAM:
MRI OF THE RIGHT ANKLE WITHOUT CONTRAST
TECHNIQUE: Multiplanar, multisequence MR imaging of the ankle was performed. No
intravenous contrast was administered.

[Series 4: T2 fat-sat · axial · 3.0mm · 0.50mm/px · z∈[-93,+28]mm · 9 of 32 slices shown (1 of 2)]
[im 1/32]
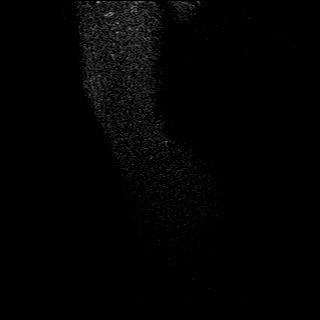
[im 4/32]
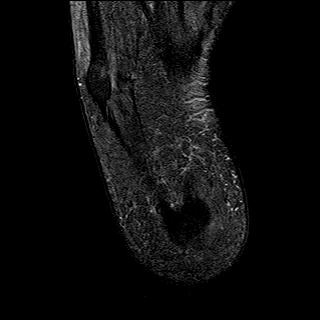
[im 8/32]
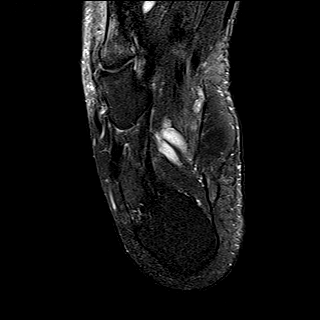
[im 12/32]
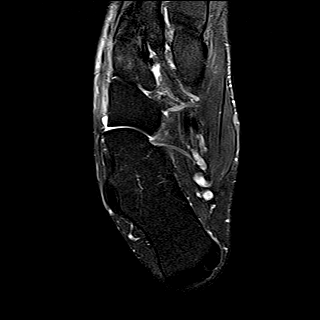
[im 16/32]
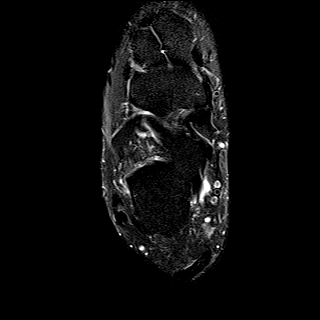
[im 20/32]
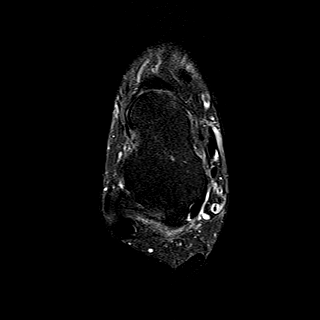
[im 24/32]
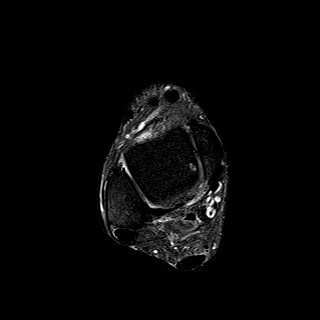
[im 28/32]
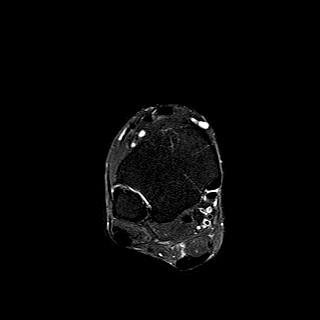
[im 32/32]
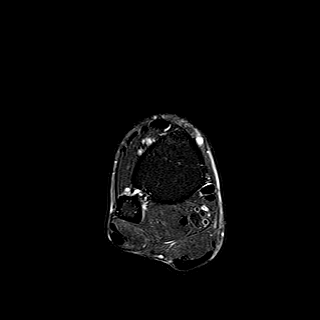

[Series 5: PD fat-sat · axial · 3.0mm · 0.50mm/px · z∈[-93,+28]mm · 9 of 32 slices shown]
[im 1/32]
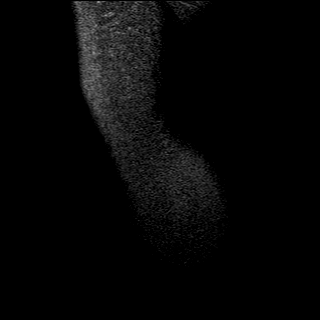
[im 4/32]
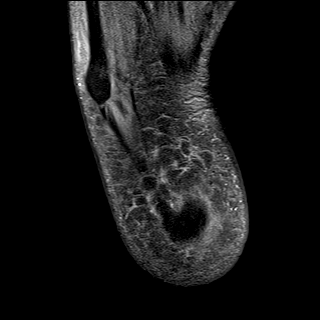
[im 8/32]
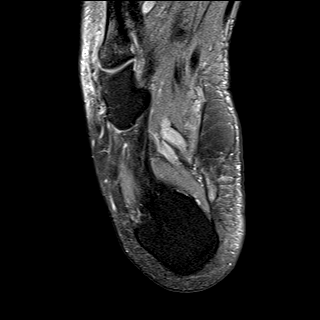
[im 12/32]
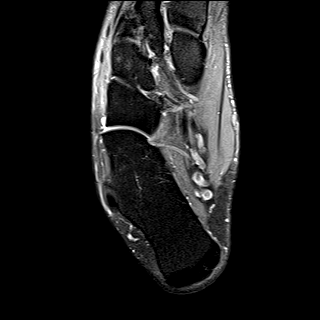
[im 16/32]
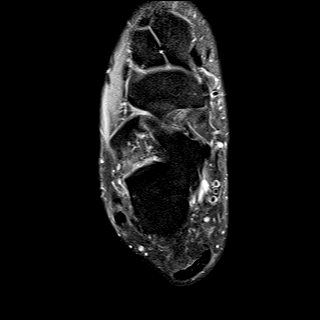
[im 20/32]
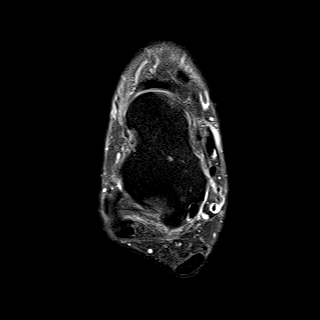
[im 24/32]
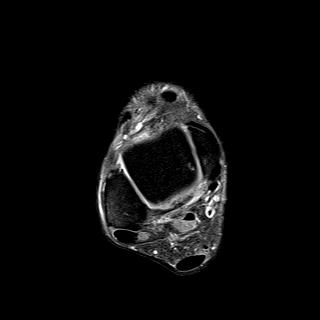
[im 28/32]
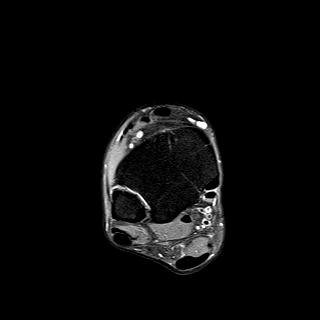
[im 32/32]
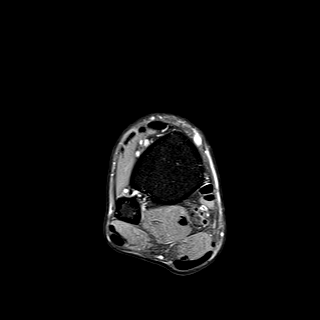

[Series 6: T1 · sagittal · 4.0mm · 0.56mm/px · 6 of 22 slices shown]
[im 1/22]
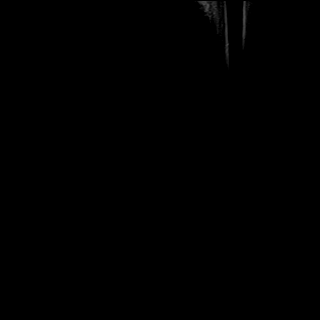
[im 5/22]
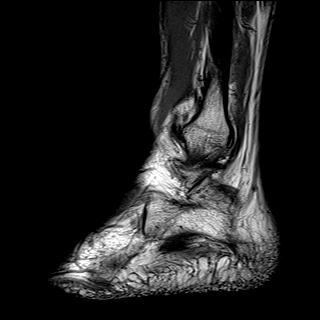
[im 9/22]
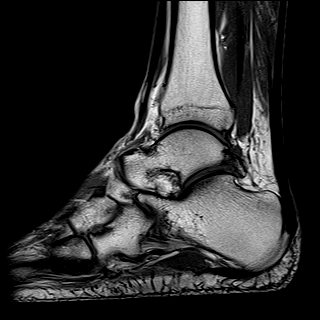
[im 13/22]
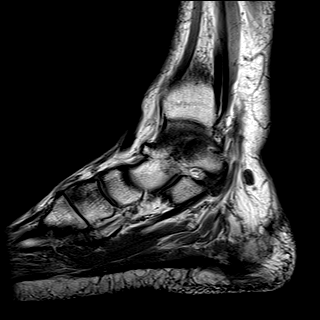
[im 17/22]
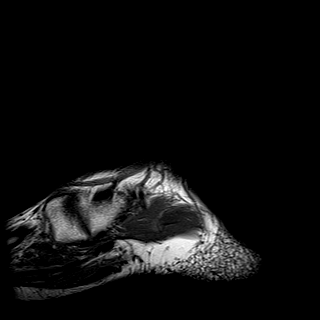
[im 22/22]
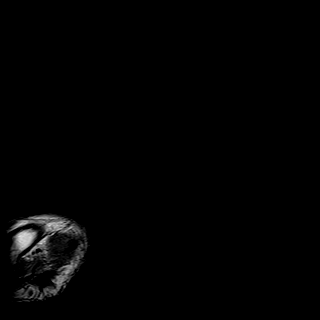

[Series 7: STIR · sagittal · 4.0mm · 0.35mm/px · 5 of 22 slices shown]
[im 1/22]
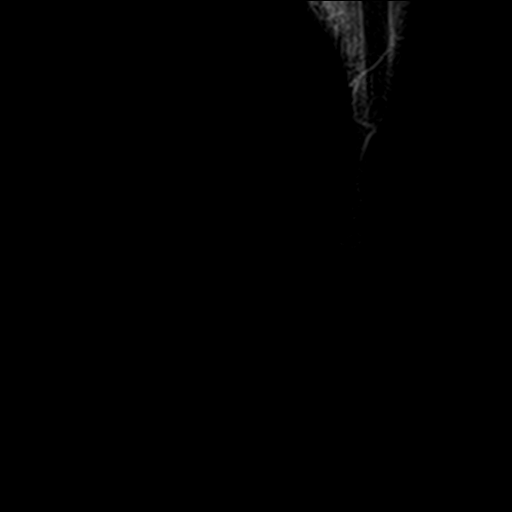
[im 5/22]
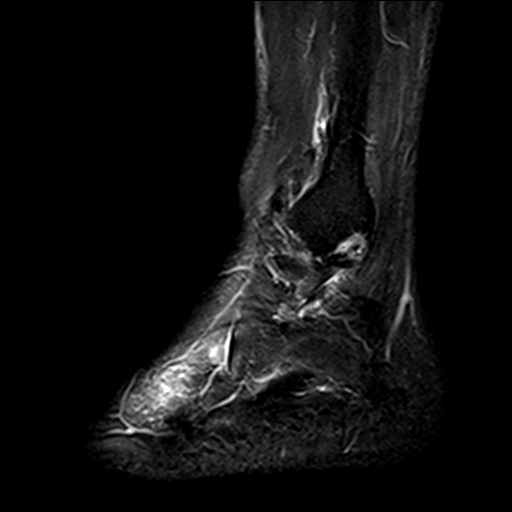
[im 9/22]
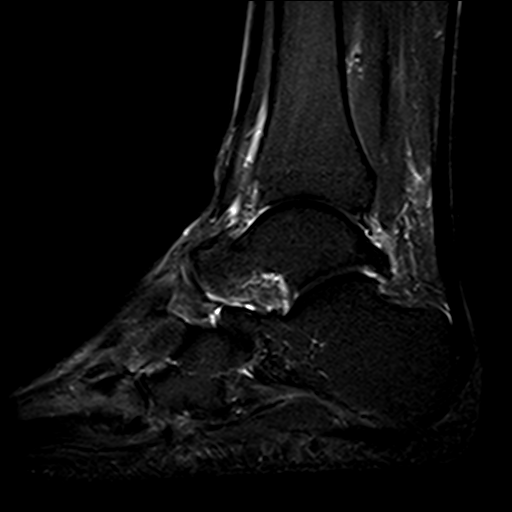
[im 13/22]
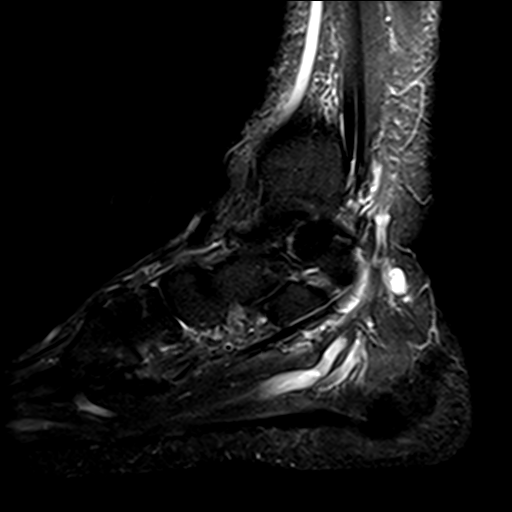
[im 17/22]
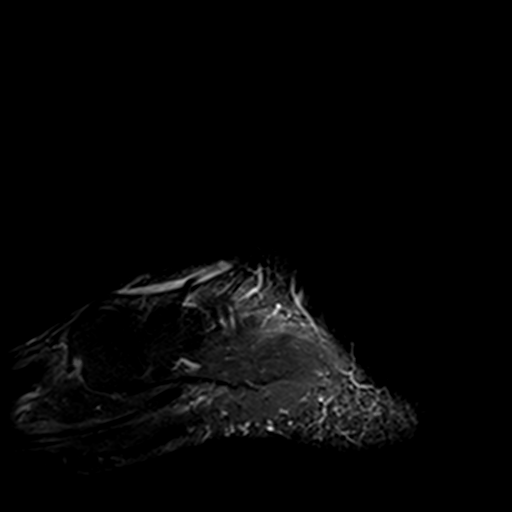

[Series 8: T2 fat-sat · coronal · 3.0mm · 0.50mm/px · 8 of 37 slices shown (2 of 2)]
[im 1/37]
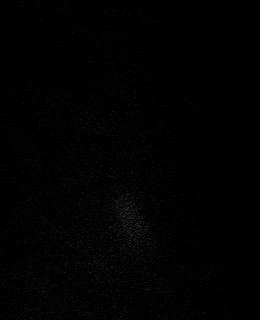
[im 5/37]
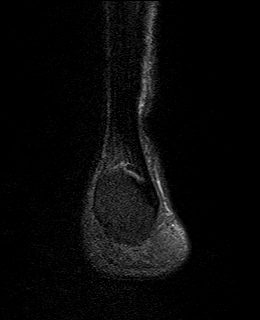
[im 13/37]
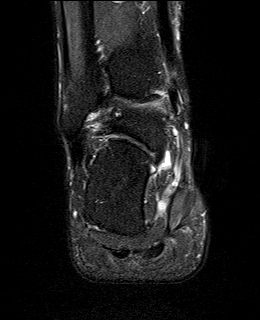
[im 17/37]
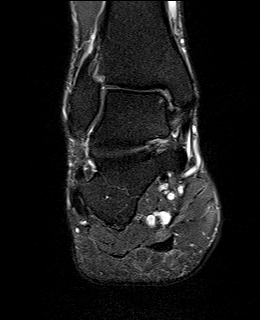
[im 21/37]
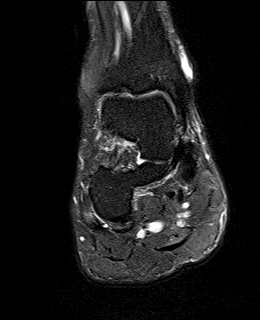
[im 25/37]
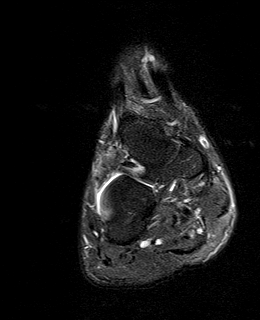
[im 33/37]
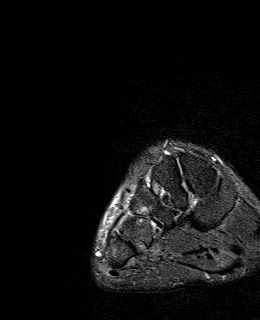
[im 37/37]
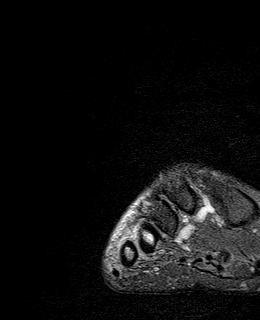

[37 of 40 positions shown; findings below may reference images not displayed]

FINDINGS: TENDONS

Peroneal: Moderate tendinosis of the distal peroneus longus tendon
beyond the level of the peroneal tubercle. Peroneus brevis appears
intact. No tenosynovitis.

Posteromedial: Tibialis posterior, flexor hallucis longus, and
flexor digitorum longus tendons are intact and normally positioned.
Trace tenosynovitis involving the posteromedial ankle tendons.

Anterior: Tibialis anterior, extensor hallucis longus, and extensor
digitorum longus tendons are intact and normally positioned.

Achilles: Intact.

Plantar Fascia: Intact.

LIGAMENTS

Lateral: Intact tibiofibular ligaments. Anterior talofibular
ligament is not identified, likely chronically torn. The posterior
talofibular ligament is intact. Intact calcaneofibular ligament.

Medial: The deltoid and visualized portions of the spring ligament
appear intact.

CARTILAGE AND BONES

Ankle Joint: Mild chondral thinning with small marginal osteophytes
along the anterior and posterior margins of the tibiotalar joint. No
effusion.

Subtalar Joints/Sinus Tarsi: No cartilage defect. No effusion.
Preservation of the anatomic fat within the sinus tarsi.

Bones: No acute fracture. No malalignment. Bone marrow edema is seen
within the fourth and fifth metatarsal shafts at the edge of the
field of view (series 4, images 26 and 28). Minimal degenerative
joint disease of the third, fourth, and fifth TMT joints with small
marginal osteophytes. Small bidirectional calcaneal enthesophytes.
No suspicious bone lesion.

Other: Small ganglion cyst is seen at the posteromedial aspect of
the hindfoot arising from the posterior subtalar joint.
IMPRESSION: 1. Bone marrow edema within the fourth and fifth metatarsal shafts
seen at the edge of the field of view. Findings may represent stress
related changes. A dedicated MRI of the forefoot could be obtained
for further evaluation as clinically indicated.
2. Moderate tendinosis of the distal peroneus longus tendon.
3. Mild tibiotalar osteoarthritis.

## 2021-07-18 NOTE — Telephone Encounter (Signed)
-----   Message from Garrel Ridgel, [place] sent at 07/17/2021 10:48 AM EST ----- According to the MRI we need additional evaluation ie another MRI of the FF to better visualize  the midfoot.  Please let him know and order the MRI.  Thanks.

## 2021-08-08 ENCOUNTER — Ambulatory Visit
Admission: RE | Admit: 2021-08-08 | Discharge: 2021-08-08 | Disposition: A | Discharge status: Home / Self Care | Payer: No Typology Code available for payment source | Source: Ambulatory Visit | Attending: Family Medicine | Admitting: Family Medicine

## 2021-08-08 DIAGNOSIS — Z87891 Personal history of nicotine dependence: Secondary | ICD-10-CM

## 2021-08-08 DIAGNOSIS — Z136 Encounter for screening for cardiovascular disorders: Secondary | ICD-10-CM

## 2021-08-08 IMAGING — CT CT CARDIAC CORONARY ARTERY CALCIUM SCORE
3 series · 14 of 20 positions shown, 16 images · non-contrast
Comparison: None.

CLINICAL DATA: Hyperlipidemia, former smoker.

EXAM:
CT CARDIAC CORONARY ARTERY CALCIUM SCORE
TECHNIQUE: Non-contrast imaging through the heart was performed using
prospective ECG gating. Image post processing was performed on an
independent workstation, allowing for quantitative analysis of the
heart and coronary arteries. Note that this exam targets the heart
and the chest was not imaged in its entirety.

[Series 2: calcium scoring 2.00 qr36 bestdiast 69% hrt calciu · axial · 0.39mm/px · z∈[+1602,+1688]mm · 4 of 73 slices shown]
[im 15/73  vessel]
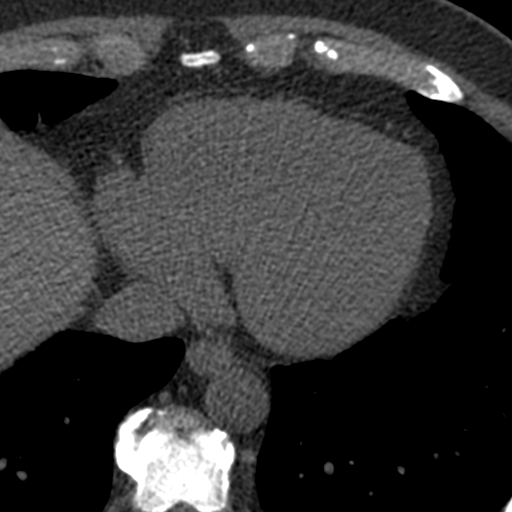
[im 29/73  vessel]
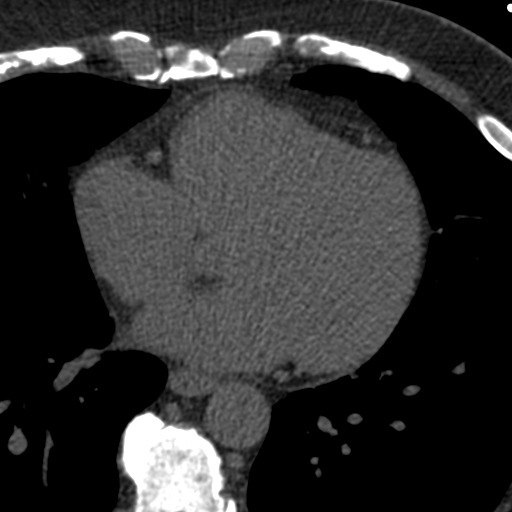
[im 44/73  vessel]
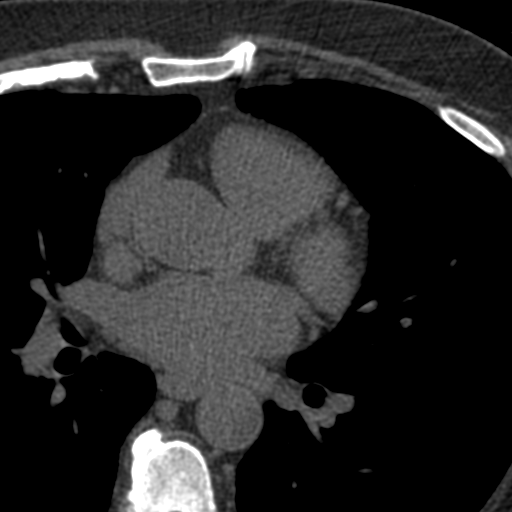
[im 58/73  vessel]
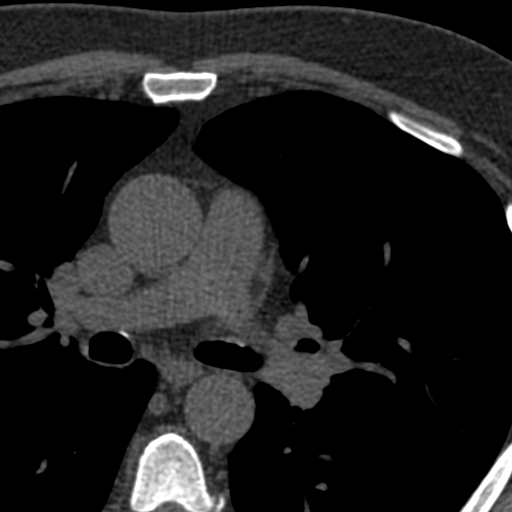

[Series 3: calcium scoring 2.00 br40 bestdiast 69% axial · axial · 0.67mm/px · z∈[+1586,+1690]mm · 5 of 80 slices shown, 7 images]
[im 14/80  vessel]
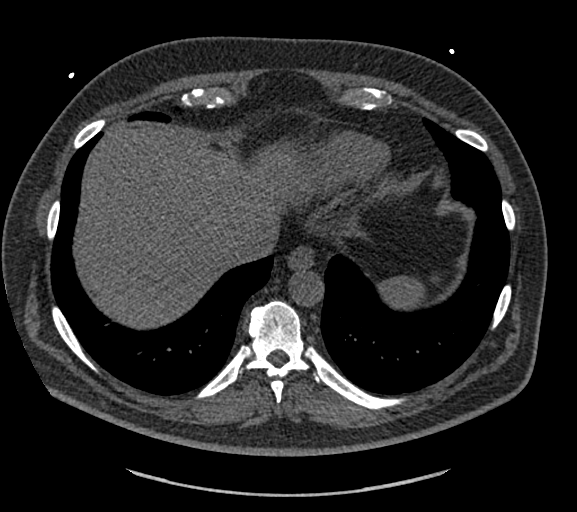
[im 14/80  lung]
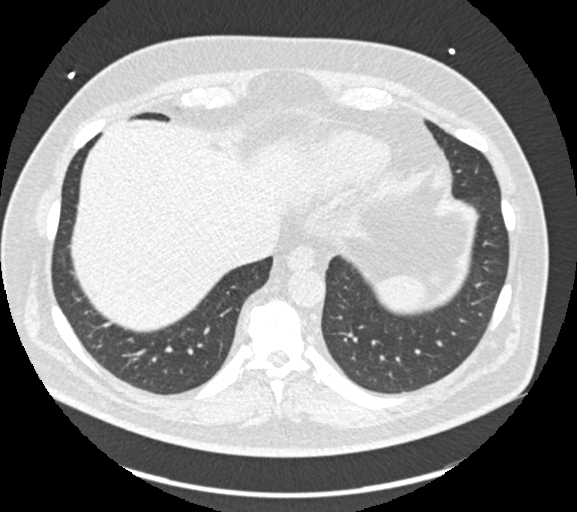
[im 27/80  vessel]
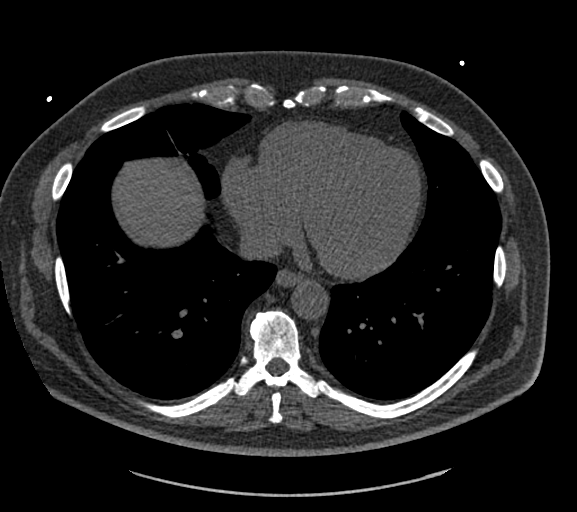
[im 40/80  vessel]
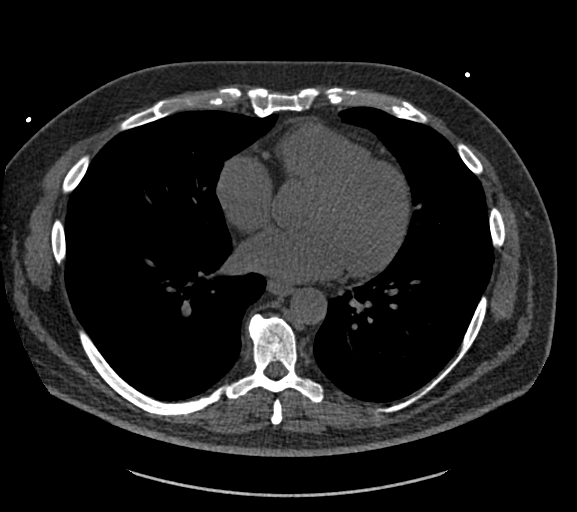
[im 53/80  vessel]
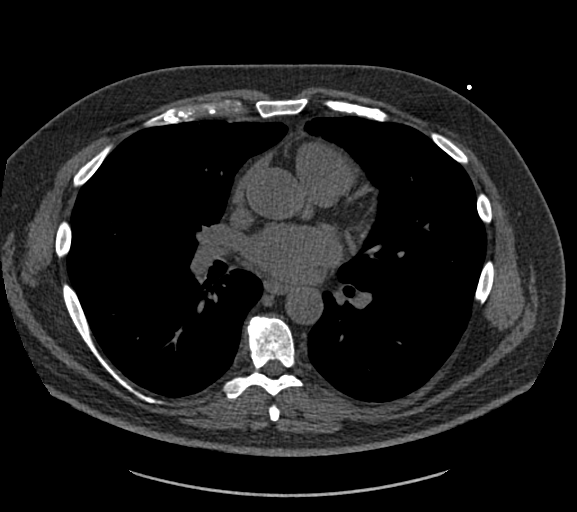
[im 66/80  vessel]
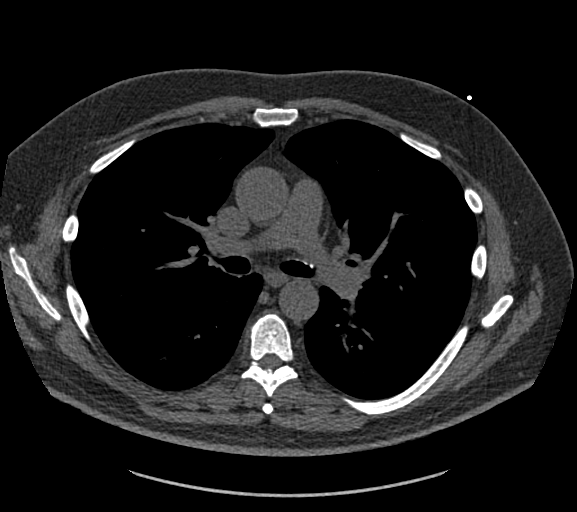
[im 66/80  lung]
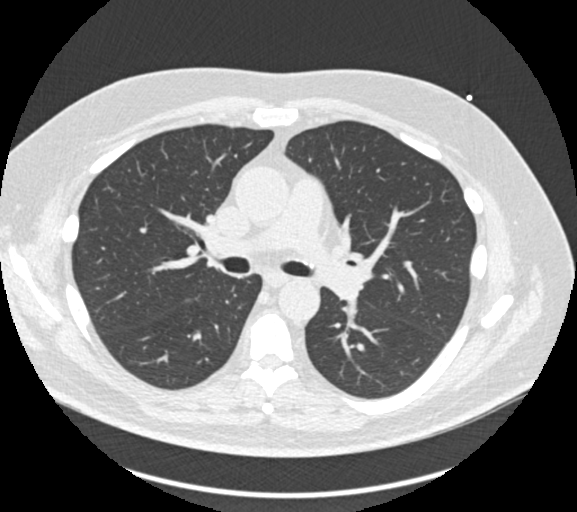

[Series 9: calcium scoring 2.00 br60 bestdiast 69% lungs · axial · 0.67mm/px · z∈[+1586,+1690]mm · 5 of 80 slices shown]
[im 14/80  vessel]
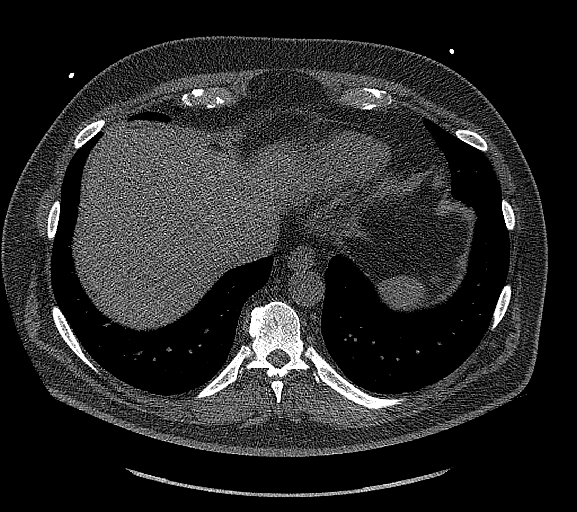
[im 27/80  vessel]
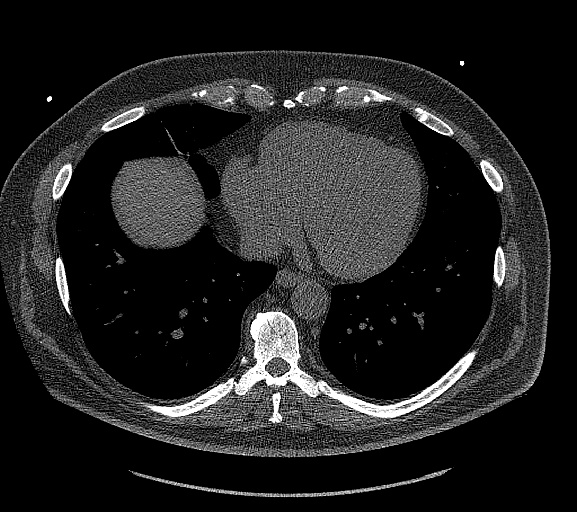
[im 40/80  vessel]
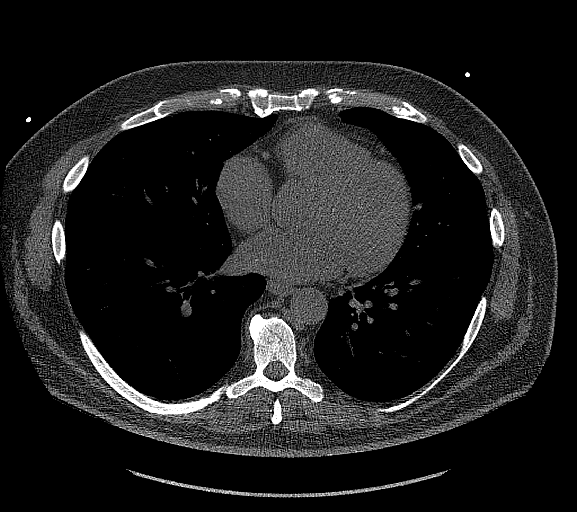
[im 53/80  vessel]
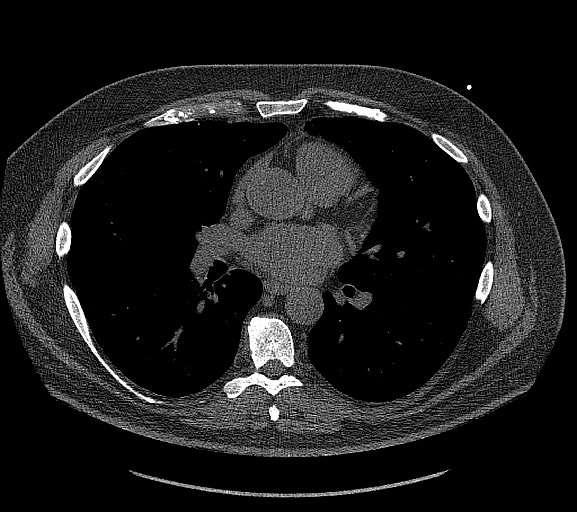
[im 66/80  vessel]
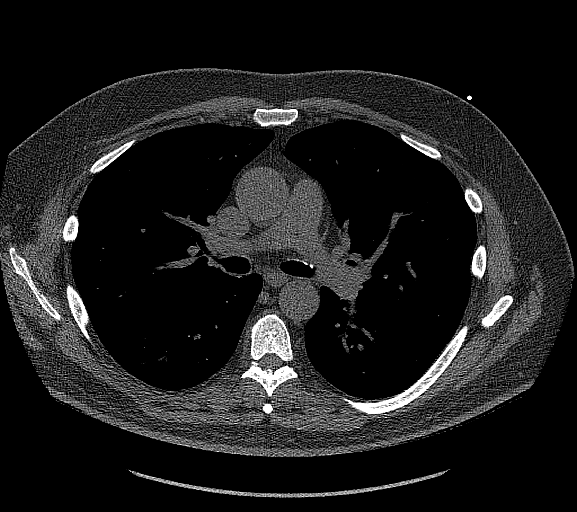

[14 of 20 positions shown; findings below may reference images not displayed]

FINDINGS: CORONARY CALCIUM SCORES:

Left Main: 0

LAD: 0

LCx: 0

RCA: 0

Total Agatston Score: 0

[HOSPITAL] percentile: 0

AORTA MEASUREMENTS:

Ascending Aorta: 36 mm

Descending Aorta: 24 mm

OTHER FINDINGS:

Heart is normal size. Aorta normal caliber. No adenopathy. Small 2-3
mm nodules along the minor fissure and in the right middle lobe. 2
mm right upper lobe nodule on image 22. There is suggestion these
may be calcified, the too small to characterize. No effusions. No
acute findings in the upper abdomen. Chest wall soft tissues
unremarkable. No acute bony abnormality.
IMPRESSION: No visible coronary artery calcifications. Total coronary calcium
score of 0.

Small scattered 2-3 mm pulmonary nodules which may be calcified but
difficult to characterize due to their small size. No follow-up
needed if patient is low-risk (and has no known or suspected primary
neoplasm). Non-contrast chest CT can be considered in 12 months if
patient is high-risk. This recommendation follows the consensus
statement: Guidelines for Management of Incidental Pulmonary Nodules
Detected on CT Images: From the [HOSPITAL] [K9]; Radiology

## 2021-08-20 ENCOUNTER — Other Ambulatory Visit: Payer: 59

## 2021-08-23 ENCOUNTER — Encounter: Payer: Self-pay | Admitting: Podiatry

## 2021-08-26 NOTE — Telephone Encounter (Signed)
Denied on 02/15 for the following:  Based on [employer] Musculoskeletal Imaging Guidelines Section(s): Foot (MS 27) and 1.0 General  Guidelines, we cannot approve this request. Your healthcare provider told us that you have right  foot pain. The request cannot be approved because: Imaging requires six weeks of provider directed treatment to be completed. Supported treatments  include (but are not limited to) drugs for swelling or pain, an in office workout (physical  therapy), and/or oral or injected steroids. This must have been completed in the past three  months without improved symptoms. Contact (via office visit, phone, email, or messaging) must  occur after the treatment is completed. This has not been met because: You have not completed six weeks of provider directed treatment. The provider directed treatment did not occur within the last three months.

## 2021-09-12 ENCOUNTER — Encounter: Payer: Self-pay | Admitting: Podiatry

## 2021-09-17 ENCOUNTER — Other Ambulatory Visit: Payer: Self-pay

## 2021-09-17 ENCOUNTER — Ambulatory Visit: Payer: 59 | Admitting: Podiatry

## 2021-09-17 ENCOUNTER — Encounter: Payer: Self-pay | Admitting: Podiatry

## 2021-09-17 DIAGNOSIS — M7671 Peroneal tendinitis, right leg: Secondary | ICD-10-CM

## 2021-09-17 DIAGNOSIS — M778 Other enthesopathies, not elsewhere classified: Secondary | ICD-10-CM

## 2021-09-17 NOTE — Progress Notes (Signed)
He presents today for a follow-up of his MRI read he states that some days are better than others. ? ?Objective: Vital signs are stable alert and oriented x3.  Primary findings on the MRI read today discussed possible fraying of the peroneus brevis which she does not show any symptoms.  Peroneus longus does demonstrate some tendinosis and some osteoarthritic changes at the fourth fifth tarsometatarsal joint. ? ?Assessment: At this point due to the osteoarthritis in the peroneus longus tendinitis.  Physical therapy may be of best choice. ? ?Plan: Sending to physical therapy for rehab.  Should this fail to alleviate his symptoms totally we will need to consider orthotics.  I also discussed possibly getting a pair of Hoka's for the arch support. ?

## 2021-09-18 ENCOUNTER — Encounter: Payer: Self-pay | Admitting: Physical Therapy

## 2021-09-18 ENCOUNTER — Ambulatory Visit: Payer: 59 | Admitting: Physical Therapy

## 2021-09-18 DIAGNOSIS — R262 Difficulty in walking, not elsewhere classified: Secondary | ICD-10-CM | POA: Diagnosis not present

## 2021-09-18 DIAGNOSIS — M6281 Muscle weakness (generalized): Secondary | ICD-10-CM | POA: Diagnosis not present

## 2021-09-18 DIAGNOSIS — M25571 Pain in right ankle and joints of right foot: Secondary | ICD-10-CM | POA: Diagnosis not present

## 2021-09-18 NOTE — Therapy (Deleted)
?OUTPATIENT PHYSICAL THERAPY TREATMENT NOTE ? ? ?Patient Name: Jimmy Stewart ?MRN: [id number] ?DOB:1956-07-06, 66 y.o., male ?Today's Date: 09/18/2021 ? ?PCP: Cari Caraway, MD ?REFERRING PROVIDER: Cari Caraway, MD ? ? ? ?No past medical history on file. ?No past surgical history on file. ?Patient Active Problem List  ? Diagnosis Date Noted  ? Diverticulosis 11/11/2018  ? Enthesopathy of left hip region 03/31/2017  ? Internal hemorrhoids 01/22/2017  ? Lower back injury, subsequent encounter 06/06/1993  ? ? ?PCP: Cari Caraway, MD ? ?REFERRING PROVIDER: Garrel Ridgel, DPM ? ?REFERRING DIAG: ? M77.8 (ICD-10-CM) - Capsulitis of foot, right ?M76.71 (ICD-10-CM) - Peroneal tendinitis of right lower extremity ? ?THERAPY DIAG:  ?Pain in right ankle and joints of right foot ? ?Difficulty in walking, not elsewhere classified ? ?Muscle weakness (generalized) ? ?ONSET DATE: 2 years ago February ? ?SUBJECTIVE:  ? ?SUBJECTIVE STATEMENT: ?09/18/2021 ?*** ? ? 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 Sates that it hurt really bad. States about 1.5 weeks later he went to [facility] and had a hairline fraction. States that he wore a boot for a while. States that it got better but that the pain never went away. States that he had plantar fascitis and that got better but he continues to have lateral foot pain. States that he has a history of rolling his ankle and has always had weak ankles. States that he is now on Meloxicam which helps but if doesn't have his medication he hurts. No injections on right side, wears a compression wrap around the arch and that helps sometimes. Everting foot increases pain.  ? ?States that he also has knee pain and is going to see his PA for that. States that he has pain on the inside of the left knee and he wears a knee brace and he sees the PA in April and has not seen anyone for that yet. ? ?  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 ?PAIN:  ?Are you having pain? Yes: NPRS scale: 2/10 ?Pain location: right foot ?Pain description: sharp, ache at rest ?Aggravating factors: weightbearing ?Relieving factors: medications ? ? ?FALLS:  ?Has patient fallen in last 6 months? No, Number of falls: 0 ? ?OCCUPATION: retired. ? ?PLOF: Independent, was walking 10K steps easily and up to 15K  (currently averages about 5-6K steps/day) ? ?PATIENT GOALS no pain in the ankle and improve overall activity ? ? ?OBJECTIVE:  ? ?DIAGNOSTIC FINDINGS:  ?MRI 110/23 ?IMPRESSION: ?1. Bone marrow edema within the fourth and fifth metatarsal shafts ?seen at the edge of the field of view. Findings may represent stress ?related changes. A dedicated MRI of the forefoot could be obtained ?for further evaluation as clinically indicated. ?2. Moderate tendinosis of the distal peroneus longus tendon. ?3. Mild tibiotalar osteoarthritis. ? ? ? ?COGNITION: ? Overall cognitive status: Within functional limits for tasks assessed   ?  ?SENSATION: ?WFL ? ? Balance: ?  SLSL L 30 seconds, R - unable loses balance  ? ?POSTURE:  ?Right ankle rest in about 15 degrees of hindfoot inversion ? ?PALPATION: ?Tenderness to palpation along right 4th and 5th met  bases, Swelling noted throughout right midfoot ? ? Edema Measurements: ? Figure 8 L 53.0cm, R 53.5cm ? At Metatarsals (circumference) L 23.5cm, R 24.75cm ? ? LE Measurements ?Lower Extremity Right ?09/18/2021 Left ?09/18/2021  ? PROM MMT PROM MMT  ?Hip Flexion      ?Hip Extension      ?Hip Abduction  3  3+  ?Hip Adduction      ?Hip Internal rotation 30  45   ?Hip External rotation 45  50   ?Knee Flexion      ?Knee Extension      ?Ankle Dorsiflexion knee bent/straight  15/10 4 12/10 5  ?Ankle Plantarflexion 55  60   ?Ankle Inversion hindfoot 30 4+ 15 4+  ?Ankle Eversion hind foot 0 4- 2 4+  ? (Blank rows = not tested) ? * pain ? ? ?LOWER EXTREMITY SPECIAL TESTS:  ?Hip special tests: Trendelenburg test: positive  and Ely's test: positive  Ankle special tests: Navicular drop - R>L; Hip tests positive bilaterally ? ?GAIT: ?Distance walked: 25 ?Comments: walks in excessive supination along the lateral side of his foot on the right ? ? ? ?TODAY'S TREATMENT: ?09/18/2021 ?*** ? ? ?Previous interventions: ?Therapeutic Exercise: ? Aerobic: ?Supine: ?Prone: ? Seated: ankle Inversion/eversion - focus on knee/hip control - x15 R, ankle mobility at forefoot- focus on eversion/pronation of forefoot, DF - focus on straight up (no inversion) x15 R 5" holds ? Standing: ?Neuromuscular Re-education: ?Manual Therapy: ?Therapeutic Activity: ?Self Care: ?Trigger Point Dry Needling:  ?Modalities:  ? ? ? ?PATIENT EDUCATION:  ?Education details: on current presentation, on HEP, on anatomy, on how he is walking and presenting, and POC*** ?Person educated: Patient ?Education method: Explanation, Demonstration, and Handouts ?Education comprehension: verbalized understanding ? ? ? ?HOME EXERCISE PROGRAM: ?[id number] ? ?ASSESSMENT: ? ?CLINICAL IMPRESSION: ?09/18/2021 ?*** ? ?Eval: Patient is a 66 y.o. male who was seen today for physical therapy evaluation and treatment for right foot pain.Patient presents with history of left hip pain, back pain and recent left knee injury. Bilateral hip weakness noted and poor gait mechanics noted on right with foot excessively supinating with weight bearing. Educated patient on current condition and plan moving forward. Patient would greatly benefit from skilled PT to improve overall function and quality of life. ? ? ?OBJECTIVE IMPAIRMENTS Abnormal gait, decreased activity tolerance, decreased balance, decreased endurance, decreased mobility, difficulty walking, decreased ROM,  decreased strength, increased edema, improper body mechanics, and pain.  ? ?ACTIVITY LIMITATIONS community activity, yard work, and shopping.  ? ?PERSONAL FACTORS Age, Fitness, 1 comorbidity: low back pain, 1-2 comorbidities: left knee pain/injury, and 3+ comorbidities: chronic ankle sprainer on R are also affecting patient's functional outcome.  ? ? ?REHAB POTENTIAL: Good ? ?CLINICAL DECISION MAKING: Stable/uncomplicated ? ?EVALUATION COMPLEXITY: Low ? ? ?GOALS: ?Goals reviewed with patient?  yes ? ?SHORT TERM GOALS: ? ?Patient will be independent in self management strategies to improve quality of life and functional outcomes. ?Baseline: new program ?Target date: 10/30/2021 ?Goal status: INITIAL ? ?2.  Patient will report at least 50% improvement in overall symptoms and/or function to demonstrate improved functional mobility ?Baseline: 0% ?Target date: 10/30/2021 ?Goal status: INITIAL ? ?3.  Patient will be able to balance on R leg in SLS for at least 20 seconds to demonstrate improved static balance ?Baseline: unable ?Target date: 10/30/2021 ?Goal status: INITIAL ? ? ?LONG TERM GOALS: ? ?Patient will report at least 75% improvement in overall symptoms and/or function to demonstrate improved functional mobility ?Baseline: 0% ?Target date: 12/11/2021 ?  Goal status: INITIAL ? ?2.  Patient will be able to report walking over 10,000 steps daily at least 5x in one week to return to prior level of function ?Baseline: <6000 steps daily regularly ?Target date: 12/11/2021 ?Goal status: INITIAL ? ?3.  Patient will be able to report improved awareness of right foot with functional activities ?Target date: 12/11/2021 ?Goal status: INITIAL ? ?4.  Patient will be able to stand on right leg in SLS for at least 30 seconds to demonstrate improved static balance ?Baseline: unable ?Target date: 12/11/2021 ?Goal status: INITIAL ? ? ? ? ?PLAN: ?PT FREQUENCY: 1-2x/week for total of 12 visits ? ?PT DURATION: 12 weeks ? ?PLANNED INTERVENTIONS:  Therapeutic exercises, Therapeutic activity, Neuromuscular re-education, Balance training, Gait training, Patient/Family education, Joint manipulation, Joint mobilization, Stair training, Canalith repositioni

## 2021-09-18 NOTE — Therapy (Signed)
?OUTPATIENT PHYSICAL THERAPY LOWER EXTREMITY EVALUATION ? ? ?Patient Name: Jimmy Stewart ?MRN: [id number] ?DOB:19-Aug-1955, 66 y.o., male ?Today's Date: 09/18/2021 ? ? PT End of Session - 09/18/21 1343   ? ? Visit Number 1   ? Number of Visits 12   ? Date for PT Re-Evaluation 12/11/21   ? Authorization Type [employer]   ? Progress Note Due on Visit 10   ? PT Start Time 1345   ? PT Stop Time 1455   ? PT Time Calculation (min) 70 min   ? Activity Tolerance Patient tolerated treatment well   ? ?  ?  ? ?  ? ? ?History reviewed. No pertinent past medical history. ?History reviewed. No pertinent surgical history. ?Patient Active Problem List  ? Diagnosis Date Noted  ? Diverticulosis 11/11/2018  ? Enthesopathy of left hip region 03/31/2017  ? Internal hemorrhoids 01/22/2017  ? Lower back injury, subsequent encounter 06/06/1993  ? ? ?PCP: Cari Caraway, MD ? ?REFERRING PROVIDER: Garrel Ridgel, DPM ? ?REFERRING DIAG: ? M77.8 (ICD-10-CM) - Capsulitis of foot, right ?M76.71 (ICD-10-CM) - Peroneal tendinitis of right lower extremity ? ?THERAPY DIAG:  ?Pain in right ankle and joints of right foot ? ?Difficulty in walking, not elsewhere classified ? ?Muscle weakness (generalized) ? ?ONSET DATE: 2 years ago February ? ?SUBJECTIVE:  ? 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 Sates that it hurt really bad. States about 1.5 weeks later he went to [facility] and had a hairline fraction. States that he wore a boot for a while. States that it got better but that the pain never went away. States that he had plantar fascitis and that got better but he continues to have lateral foot pain. States that he has a history of rolling his ankle and has always had weak ankles. States that he is now on Meloxicam which helps but if doesn't have his medication he hurts. No injections on right side, wears a compression wrap around the arch and  that helps sometimes. Everting foot increases pain.  ? ?States that he also has knee pain and is going to see his PA for that. States that he has pain on the inside of the left knee and he wears a knee brace and he sees the PA in April and has not seen anyone for that yet. ? ?  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 ?PAIN:  ?Are you having pain? Yes: NPRS scale: 2/10 ?Pain location: right foot ?Pain description: sharp, ache at rest ?Aggravating factors: weightbearing ?Relieving factors: medications ? ? ?FALLS:  ?Has patient fallen in last 6 months? No, Number of falls: 0 ? ?OCCUPATION: retired. ? ?PLOF: Independent, was walking 10K steps easily and up to 15K  (currently averages about 5-6K steps/day) ? ?PATIENT GOALS no pain in the ankle and improve overall activity ? ? ?OBJECTIVE:  ? ?DIAGNOSTIC FINDINGS:  ?MRI 110/23 ?IMPRESSION: ?1. Bone marrow edema within the fourth and fifth metatarsal shafts ?seen at the edge of the field of view. Findings may represent stress ?related changes. A dedicated MRI of the forefoot could be obtained ?for further evaluation as  clinically indicated. ?2. Moderate tendinosis of the distal peroneus longus tendon. ?3. Mild tibiotalar osteoarthritis. ? ? ? ?COGNITION: ? Overall cognitive status: Within functional limits for tasks assessed   ?  ?SENSATION: ?WFL ? ? Balance: ?  SLSL L 30 seconds, R - unable loses balance  ? ?POSTURE:  ?Right ankle rest in about 15 degrees of hindfoot inversion ? ?PALPATION: ?Tenderness to palpation along right 4th and 5th met bases, Swelling noted throughout right midfoot ? ? Edema Measurements: ? Figure 8 L 53.0cm, R 53.5cm ? At Metatarsals (circumference) L 23.5cm, R 24.75cm ? ? LE Measurements ?Lower  Extremity Right ?09/18/2021 Left ?09/18/2021  ? PROM MMT PROM MMT  ?Hip Flexion      ?Hip Extension      ?Hip Abduction  3  3+  ?Hip Adduction      ?Hip Internal rotation 30  45   ?Hip External rotation 45  50   ?Knee Flexion      ?Knee Extension      ?Ankle Dorsiflexion knee bent/straight 15/10 4 12/10 5  ?Ankle Plantarflexion 55  60   ?Ankle Inversion hindfoot 30 4+ 15 4+  ?Ankle Eversion hind foot 0 4- 2 4+  ? (Blank rows = not tested) ? * pain ? ? ?LOWER EXTREMITY SPECIAL TESTS:  ?Hip special tests: Trendelenburg test: positive  and Ely's test: positive  Ankle special tests: Navicular drop  - R>L; Hip tests positive bilaterally ? ?GAIT: ?Distance walked: 25 ?Comments: walks in excessive supination along the lateral side of his foot on the right ? ? ? ?TODAY'S TREATMENT: ?09/18/2021 ?Therapeutic Exercise: ? Aerobic: ?Supine: ?Prone: ? Seated: ankle Inversion/eversion - focus on knee/hip control - x15 R, ankle mobility at forefoot- focus on eversion/pronation of forefoot, DF - focus on straight up (no inversion) x15 R 5" holds ? Standing: ?Neuromuscular Re-education: ?Manual Therapy: ?Therapeutic Activity: ?Self Care: ?Trigger Point Dry Needling:  ?Modalities:  ? ? ? ?PATIENT EDUCATION:  ?Education details: on current presentation, on HEP, on anatomy, on how he is walking and presenting, and POC ?Person educated: Patient ?Education method: Explanation, Demonstration, and Handouts ?Education comprehension: verbalized understanding ? ? ? ?HOME EXERCISE PROGRAM: ?[id number] ? ?ASSESSMENT: ? ?CLINICAL IMPRESSION: ?Patient is a 66 y.o. male who was seen today for physical therapy evaluation and treatment for right foot pain.Patient presents with history of left hip pain, back pain and recent left knee injury. Bilateral hip weakness noted and poor gait mechanics noted on right with foot excessively supinating with weight bearing. Educated patient on current condition and plan moving forward. Patient would greatly benefit  from skilled PT to improve overall function and quality of life. ? ? ?OBJECTIVE IMPAIRMENTS Abnormal gait, decreased activity tolerance, decreased balance, decreased endurance, decreased mobility, difficulty walking, decreased ROM, decreased strength, increased edema, improper body mechanics, and pain.  ? ?ACTIVITY LIMITATIONS community activity, yard work, and shopping.  ? ?PERSONAL FACTORS Age, Fitness, 1 comorbidity: low back pain, 1-2 comorbidities: left knee pain/injury, and 3+ comorbidities: chronic ankle sprainer on R  are also affecting patient's functional outcome.  ? ? ?REHAB POTENTIAL: Good ? ?CLINICAL DECISION MAKING: Stable/uncomplicated ? ?EVALUATION COMPLEXITY: Low ? ? ?GOALS: ?Goals reviewed with patient?  yes ? ?SHORT TERM GOALS: ? ?Patient will be independent in self management strategies to improve quality of life and functional outcomes. ?Baseline: new program ?Target date: 10/30/2021 ?Goal status: INITIAL ? ?2.  Patient will report at least 50% improvement in overall symptoms and/or function to demonstrate improved functional mobility ?Baseline:   0% ?Target date: 10/30/2021 ?Goal status: INITIAL ? ?3.  Patient will be able to balance on R leg in SLS for at least 20 seconds to demonstrate improved static balance ?Baseline: unable ?Target date: 10/30/2021 ?Goal status: INITIAL ? ? ? ? ? ?LONG TERM GOALS: ? ?Patient will report at least 75% improvement in overall symptoms and/or function to demonstrate improved functional mobility ?Baseline: 0% ?Target date: 12/11/2021 ?Goal status: INITIAL ? ?2.  Patient will be able to report walking over 10,000 steps daily at least 5x in one week to return to prior level of function ?Baseline: <6000 steps daily regularly ?Target date: 12/11/2021 ?Goal status: INITIAL ? ?3.  Patient will be able to report improved awareness of right foot with functional activities ?Target date: 12/11/2021 ?Goal status: INITIAL ? ?4.  Patient will be able to stand on right leg in SLS for at  least 30 seconds to demonstrate improved static balance ?Baseline: unable ?Target date: 12/11/2021 ?Goal status: INITIAL ? ? ? ? ?PLAN: ?PT FREQUENCY: 1-2x/week for total of 12 visits ? ?PT DURATION: 12 wee

## 2021-10-01 ENCOUNTER — Encounter: Payer: 59 | Admitting: Physical Therapy

## 2021-10-02 NOTE — Therapy (Signed)
?OUTPATIENT PHYSICAL THERAPY TREATMENT NOTE ? ? ?Patient Name: Jimmy Stewart ?MRN: [id number] ?DOB:[birth year], 66 y.o., male ?Today's Date: 10/03/2021 ? ?PCP: Cari Caraway, MD ?REFERRING PROVIDER: Cari Caraway, MD ? ? PT End of Session - 10/03/21 0936   ? ? Visit Number 2   ? Number of Visits 12   ? Date for PT Re-Evaluation 12/11/21   ? Authorization Type aetna   ? Progress Note Due on Visit 10   ? PT Start Time [phone]   ? PT Stop Time 1015   ? PT Time Calculation (min) 41 min   ? Activity Tolerance Patient tolerated treatment well   ? ?  ?  ? ?  ? ? ?History reviewed. No pertinent past medical history. ?History reviewed. No pertinent surgical history. ?Patient Active Problem List  ? Diagnosis Date Noted  ? Diverticulosis 11/11/2018  ? Enthesopathy of left hip region 03/31/2017  ? Internal hemorrhoids 01/22/2017  ? Lower back injury, subsequent encounter 06/06/1993  ? ? ?PCP: Cari Caraway, MD ? ?REFERRING PROVIDER: Garrel Ridgel, DPM ? ?REFERRING DIAG: ? M77.8 (ICD-10-CM) - Capsulitis of foot, right ?M76.71 (ICD-10-CM) - Peroneal tendinitis of right lower extremity ? ?THERAPY DIAG:  ?Pain in right ankle and joints of right foot ? ?Difficulty in walking, not elsewhere classified ? ?Muscle weakness (generalized) ? ?ONSET DATE: 2 years ago February ? ?SUBJECTIVE:  ? ?SUBJECTIVE STATEMENT: ?10/03/2021 ?States that he got some new shoes and has been paying attention to how he is walking. States that he over did his activities yesterday and he felt that. States that his new shoes feel like they are supporting him well. States that when he walks correctly he doesn't have the pain. ? ? 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 Sates that it hurt really bad. States about 1.5 weeks later he went to [facility] and had a hairline fraction. States that he wore a boot for a while. States that it got better but that the pain never  went away. States that he had plantar fascitis and that got better but he continues to have lateral foot pain. States that he has a history of rolling his ankle and has always had weak ankles. States that he is now on Meloxicam which helps but if doesn't have his medication he hurts. No injections on right side, wears a compression wrap around the arch and that helps sometimes. Everting foot increases pain.  ? ?States that he also has knee pain and is going to see his PA for that. States that he has pain on the inside of the left knee and he wears a knee brace and he sees the PA in April and has not seen anyone for that yet. ? ?  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 ?PAIN:  ?Are you having pain? no: NPRS scale: 0/10 ?Pain location: right foot ?Pain description: sharp, ache at rest ?Aggravating factors: weightbearing ?Relieving factors: medications ? ? ?FALLS:  ?Has patient fallen in last 6 months? No, Number of falls: 0 ? ?OCCUPATION: retired. ? ?PLOF: Independent, was walking 10K steps easily and up to 15K  (currently averages  about 5-6K steps/day) ? ?PATIENT GOALS no pain in the ankle and improve overall activity ? ? ?OBJECTIVE:  ? ?DIAGNOSTIC FINDINGS:  ?MRI 110/23 ?IMPRESSION: ?1. Bone marrow edema within the fourth and fifth metatarsal shafts ?seen at the edge of the field of view. Findings may represent stress ?related changes. A dedicated MRI of the forefoot could be obtained ?for further evaluation as clinically indicated. ?2. Moderate tendinosis of the distal peroneus longus tendon. ?3. Mild tibiotalar osteoarthritis. ? ? ? ?COGNITION: ? Overall cognitive status: Within functional limits for tasks assessed   ?  ?SENSATION: ?WFL ? ? Balance: ?  SLSL L  30 seconds, R - unable loses balance  ? ?POSTURE:  ?Right ankle rest in about 15 degrees of hindfoot inversion ? ?PALPATION: ?Tenderness to palpation along right 4th and 5th met bases, Swelling noted throughout right midfoot ? ? Edema Measurements: ? Figure 8 L 53.0cm, R 53.5cm ? At Metatarsals (circumference) L 23.5cm, R 24.75cm ? ? LE Measurements ?Lower Extremity Right ?09/18/2021 Left ?09/18/2021  ? PROM MMT PROM MMT  ?Hip Flexion      ?Hip Extension      ?Hip Abduction  3  3+  ?Hip Adduction      ?Hip Internal rotation 30  45   ?Hip External rotation 45  50   ?Knee Flexion      ?Knee Extension      ?Ankle Dorsiflexion knee bent/straight 15/10 4 12/10 5  ?Ankle Plantarflexion 55  60   ?Ankle Inversion hindfoot 30 4+ 15 4+  ?Ankle Eversion hind foot 0 4- 2 4+  ? (Blank rows = not tested) ? * pain ? ? ?LOWER EXTREMITY SPECIAL TESTS:  ?Hip special tests: Trendelenburg test: positive  and Ely's test: positive  Ankle special tests: Navicular drop - R>L; Hip tests positive bilaterally ? ?GAIT: ?Distance walked: 25 ?Comments: walks in excessive supination along the lateral side of his foot on the right ? ? ? ?TODAY'S TREATMENT: ?10/03/2021 ?Therapeutic Exercise: ? Aerobic: ?Supine:bridges 2x15 2" holds, SLR 4x5 B, ?Side lying: clamshells x15 bilateral, hip abd x10 bilateral ? Seated: ? Standing: ?Neuromuscular Re-education: tandem -on floor/foam in/out shoes  -15 minutes, SLS with one hands support focus on level hips - 5 minutes total  ?Manual Therapy: ?Therapeutic Activity: ?Self Care: ?Trigger Point Dry Needling:  ?Modalities:  ? ? ? ?Previous interventions: ? Seated: ankle Inversion/eversion - focus on knee/hip control - x15 R, ankle mobility at forefoot- focus on eversion/pronation of forefoot, DF - focus on straight up (no inversion) x15 R 5" holds ? Standing: ? ? ?PATIENT EDUCATION:  ?Education details: on HEP, on rationale for performing exercises out of shoes. ?Person educated: Patient ?Education method:  Explanation, Demonstration, and Handouts ?Education comprehension: verbalized understanding ? ? ? ?HOME EXERCISE PROGRAM: ?[id number] ? ?ASSESSMENT: ? ?CLINICAL IMPRESSION: ?10/03/2021 ?Session focused on education and progressing HEP. Answered all questions and corrected form with hip strengthening exercises. Educated paten on goal of exercises and importance of performing exercises out of his shoes vs in his shoes. This was also practiced in clinic so patient could feel the difference. No pain noted during session. Will continue with current PIC as tolerated.  ? ?Eval: Patient is a 66 y.o. male who was seen today for physical therapy evaluation and treatment for right foot pain.Patient presents with history of left hip pain, back pain and recent left knee injury. Bilateral hip weakness noted and poor gait mechanics noted on right with foot excessively supinating with weight bearing. Educated patient  on current condition and plan moving forward. Patient would greatly benefit from skilled PT to improve overall function and quality of life. ? ? ?OBJECTIVE IMPAIRMENTS Abnormal gait, decreased activity tolerance, decreased balance, decreased endurance, decreased mobility, difficulty walking, decreased ROM, decreased strength, increased edema, improper body mechanics, and pain.  ? ?ACTIVITY LIMITATIONS community activity, yard work, and shopping.  ? ?PERSONAL FACTORS Age, Fitness, 1 comorbidity: low back pain, 1-2 comorbidities: left knee pain/injury, and 3+ comorbidities: chronic ankle sprainer on R are also affecting patient's functional outcome.  ? ? ?REHAB POTENTIAL: Good ? ?CLINICAL DECISION MAKING: Stable/uncomplicated ? ?EVALUATION COMPLEXITY: Low ? ? ?GOALS: ?Goals reviewed with patient?  yes ? ?SHORT TERM GOALS: ? ?Patient will be independent in self management strategies to improve quality of life and functional outcomes. ?Baseline: new program ?Target date: 10/30/2021 ?Goal status: INITIAL ? ?2.  Patient will  report at least 50% improvement in overall symptoms and/or function to demonstrate improved functional mobility ?Baseline: 0% ?Target date: 10/30/2021 ?Goal status: INITIAL ? ?3.  Patient will be able to balance on R

## 2021-10-03 ENCOUNTER — Ambulatory Visit: Payer: 59 | Admitting: Physical Therapy

## 2021-10-03 ENCOUNTER — Encounter: Payer: Self-pay | Admitting: Physical Therapy

## 2021-10-03 DIAGNOSIS — R262 Difficulty in walking, not elsewhere classified: Secondary | ICD-10-CM

## 2021-10-03 DIAGNOSIS — M25571 Pain in right ankle and joints of right foot: Secondary | ICD-10-CM

## 2021-10-03 DIAGNOSIS — M6281 Muscle weakness (generalized): Secondary | ICD-10-CM | POA: Diagnosis not present

## 2021-10-08 ENCOUNTER — Encounter: Payer: Self-pay | Admitting: Physical Therapy

## 2021-10-08 ENCOUNTER — Ambulatory Visit: Payer: 59 | Admitting: Physical Therapy

## 2021-10-08 DIAGNOSIS — M6281 Muscle weakness (generalized): Secondary | ICD-10-CM

## 2021-10-08 DIAGNOSIS — M25571 Pain in right ankle and joints of right foot: Secondary | ICD-10-CM

## 2021-10-08 DIAGNOSIS — R262 Difficulty in walking, not elsewhere classified: Secondary | ICD-10-CM

## 2021-10-08 NOTE — Therapy (Signed)
?OUTPATIENT PHYSICAL THERAPY TREATMENT NOTE ? ? ?Patient Name: Jimmy Stewart ?MRN: [id number] ?DOB:September 01, 1955, 66 y.o., male ?Today's Date: 10/08/2021 ? ?PCP: Cari Caraway, MD ?REFERRING PROVIDER: Cari Caraway, MD ? ? PT End of Session - 10/08/21 1215   ? ? Visit Number 3   ? Number of Visits 12   ? Date for PT Re-Evaluation 12/11/21   ? Authorization Type aetna   ? Progress Note Due on Visit 10   ? PT Start Time 1216   ? PT Stop Time 4650   ? PT Time Calculation (min) 41 min   ? Activity Tolerance Patient tolerated treatment well   ? ?  ?  ? ?  ? ? ?History reviewed. No pertinent past medical history. ?History reviewed. No pertinent surgical history. ?Patient Active Problem List  ? Diagnosis Date Noted  ? Diverticulosis 11/11/2018  ? Enthesopathy of left hip region 03/31/2017  ? Internal hemorrhoids 01/22/2017  ? Lower back injury, subsequent encounter 06/06/1993  ? ? ?PCP: Cari Caraway, MD ? ?REFERRING PROVIDER: Garrel Ridgel, DPM ? ?REFERRING DIAG: ? M77.8 (ICD-10-CM) - Capsulitis of foot, right ?M76.71 (ICD-10-CM) - Peroneal tendinitis of right lower extremity ? ?THERAPY DIAG:  ?Pain in right ankle and joints of right foot ? ?Difficulty in walking, not elsewhere classified ? ?Muscle weakness (generalized) ? ?ONSET DATE: 2 years ago February ? ?SUBJECTIVE:  ? ?SUBJECTIVE STATEMENT: ?10/08/2021 ?States that his foot is doing well he just has to focus on its position. States that his chiro said his hips are off and may benefit from a heel life but wanted to know more a bout that. ? 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 Sates that it hurt really bad. States about 1.5 weeks later he went to [facility] and had a hairline fraction. States that he wore a boot for a while. States that it got better but that the pain never went away. States that he had plantar fascitis and that got better but he continues to have lateral  foot pain. States that he has a history of rolling his ankle and has always had weak ankles. States that he is now on Meloxicam which helps but if doesn't have his medication he hurts. No injections on right side, wears a compression wrap around the arch and that helps sometimes. Everting foot increases pain.  ? ? 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 ?PAIN:  ?Are you having pain? no: NPRS scale: 0/10 ?Pain location: right foot ?Pain description: sharp, ache at rest ?Aggravating factors: weightbearing ?Relieving factors: medications ? ? ?FALLS:  ?Has patient fallen in last 6 months? No, Number of falls: 0 ? ?OCCUPATION: retired. ? ?PLOF: Independent, was walking 10K steps easily and up to 15K  (currently averages about 5-6K steps/day) ? ?PATIENT GOALS no pain in the ankle and improve overall activity ? ? ?OBJECTIVE:  ? Balance: ?  SLSL L 30 seconds, R - unable loses balance  ? ?POSTURE:  ?Right ankle rest in about 15 degrees of hindfoot inversion ? ?PALPATION: ?Tenderness to palpation along right 4th and 5th met bases, Swelling noted throughout right midfoot ? ? Edema Measurements: ? Figure 8 L 53.0cm, R 53.5cm ?  At Metatarsals (circumference) L 23.5cm, R 24.75cm ? ? LE Measurements ?Lower Extremity Right ?09/18/2021 Left ?09/18/2021  ? PROM MMT PROM MMT  ?Hip Flexion      ?Hip Extension      ?Hip Abduction  3  3+  ?Hip Adduction      ?Hip Internal rotation 30  45   ?Hip External rotation 45  50   ?Knee Flexion      ?Knee Extension      ?Ankle Dorsiflexion knee bent/straight 15/10 4 12/10 5  ?Ankle Plantarflexion 55  60   ?Ankle Inversion hindfoot 30 4+ 15 4+  ?Ankle Eversion hind foot 0 4- 2 4+  ? (Blank rows = not tested) ? * pain ? ? ?LOWER EXTREMITY SPECIAL TESTS:  ?Hip special tests: Trendelenburg test:  positive  and Ely's test: positive  Ankle special tests: Navicular drop - R>L; Hip tests positive bilaterally ? ?GAIT: ?Distance walked: 25 ?Comments: walks in excessive supination along the lateral side of his foot on the right ? ? ? ?TODAY'S TREATMENT: ?10/08/2021 ?Therapeutic Exercise: ? Aerobic: ?Supine:bridges 2x15 2"  with hip abd, holds, SLR 4x5 B, piriformis stretch x10 15" holds R, hip IR x20 bilateral  ? Seated: ? Standing: ?Neuromuscular Re-education: walking - shoulders and hips focus on more rotational movement and less frontal plane motion - practiced facing mirror. ? ? ?Previous interventions: ? Seated: ankle Inversion/eversion - focus on knee/hip control - x15 R, ankle mobility at forefoot- focus on eversion/pronation of forefoot, DF - focus on straight up (no inversion) x15 R 5" holds ? Standing: ? ? ?PATIENT EDUCATION:  ?Education details: on HEP, on anatomy, on hip motions standing, on hip length and difference between functional and true leg length, what causes each and what a heel lift would do for this. ?Person educated: Patient ?Education method: Explanation, Demonstration, and Handouts ?Education comprehension: verbalized understanding ? ? ? ?HOME EXERCISE PROGRAM: ?[id number] ? ?ASSESSMENT: ? ?CLINICAL IMPRESSION: ?10/08/2021 ?Session focused on education and answered all questions about anatomy, hip movements and how heel lift would change pelvis orientation. Added hip mobility exercises today which were tolerated well. Improved walking mechanics with visual feedback form mirror. Will continue with current POC as tolerated.  ? ?Eval: Patient is a 66 y.o. male who was seen today for physical therapy evaluation and treatment for right foot pain.Patient presents with history of left hip pain, back pain and recent left knee injury. Bilateral hip weakness noted and poor gait mechanics noted on right with foot excessively supinating with weight bearing. Educated patient on current condition and plan  moving forward. Patient would greatly benefit from skilled PT to improve overall function and quality of life. ? ? ?OBJECTIVE IMPAIRMENTS Abnormal gait, decreased activity tolerance, decreased balance, decreased endurance, decreased mobility, difficulty walking, decreased ROM, decreased strength, increased edema, improper body mechanics, and pain.  ? ?ACTIVITY LIMITATIONS community activity, yard work, and shopping.  ? ?PERSONAL FACTORS Age, Fitness, 1 comorbidity: low back pain, 1-2 comorbidities: left knee pain/injury, and 3+ comorbidities: chronic ankle sprainer on R are also affecting patient's functional outcome.  ? ? ?REHAB POTENTIAL: Good ? ?CLINICAL DECISION MAKING: Stable/uncomplicated ? ?EVALUATION COMPLEXITY: Low ? ? ?GOALS: ?Goals reviewed with patient?  yes ? ?SHORT TERM GOALS: ? ?Patient will be independent in self management strategies to improve quality of life and functional outcomes. ?Baseline: new program ?Target date: 10/30/2021 ?Goal status: INITIAL ? ?2.  Patient will report at least 50% improvement in overall symptoms and/or function to demonstrate improved functional  mobility ?Baseline: 0% ?Target date: 10/30/2021 ?Goal status: INITIAL ? ?3.  Patient will be able to balance on R leg in SLS for at least 20 seconds to demonstrate improved static balance ?Baseline: unable ?Target date: 10/30/2021 ?Goal status: INITIAL ? ? ?LONG TERM GOALS: ? ?Patient will report at least 75% improvement in overall symptoms and/or function to demonstrate improved functional mobility ?Baseline: 0% ?Target date: 12/11/2021 ?Goal status: INITIAL ? ?2.  Patient will be able to report walking over 10,000 steps daily at least 5x in one week to return to prior level of function ?Baseline: <6000 steps daily regularly ?Target date: 12/11/2021 ?Goal status: INITIAL ? ?3.  Patient will be able to report improved awareness of right foot with functional activities ?Target date: 12/11/2021 ?Goal status: INITIAL ? ?4.  Patient will be  able to stand on right leg in SLS for at least 30 seconds to demonstrate improved static balance ?Baseline: unable ?Target date: 12/11/2021 ?Goal status: INITIAL ? ? ? ? ?PLAN: ?PT FREQUENCY: 1-2x/week for total

## 2021-10-10 ENCOUNTER — Encounter: Payer: 59 | Admitting: Physical Therapy

## 2021-10-15 ENCOUNTER — Other Ambulatory Visit: Payer: Self-pay | Admitting: Podiatry

## 2021-10-22 ENCOUNTER — Encounter: Payer: Self-pay | Admitting: Physical Therapy

## 2021-10-22 ENCOUNTER — Ambulatory Visit: Payer: 59 | Admitting: Physical Therapy

## 2021-10-22 DIAGNOSIS — R262 Difficulty in walking, not elsewhere classified: Secondary | ICD-10-CM

## 2021-10-22 DIAGNOSIS — M6281 Muscle weakness (generalized): Secondary | ICD-10-CM | POA: Diagnosis not present

## 2021-10-22 DIAGNOSIS — M25571 Pain in right ankle and joints of right foot: Secondary | ICD-10-CM

## 2021-10-22 NOTE — Therapy (Addendum)
OUTPATIENT PHYSICAL THERAPY TREATMENT NOTE AND DISCHARGE NOTE  PHYSICAL THERAPY DISCHARGE SUMMARY  Visits from Start of Care: 4  Current functional level related to goals / functional outcomes:  Unable to assess due to unplanned discharge  Remaining deficits: Unable to assess due to unplanned discharge    Education / Equipment: Unable to assess due to unplanned discharge    Patient agrees to discharge. Patient goals were partially met. Patient is being discharged due to not returning since the last visit.  10:23 AM, 01/06/22 Jimmy Stewart, DPT Physical Therapy with [facility]   Patient Name: Jimmy Stewart MRN: [id number] DOB:1956/04/17, 66 y.o., male Today's Date: 10/22/2021  PCP: Cari Caraway, MD REFERRING PROVIDER: Cari Caraway, MD   PT End of Session - 10/22/21 1017     Visit Number 4    Number of Visits 12    Date for PT Re-Evaluation 12/11/21    Authorization Type aetna    Progress Note Due on Visit 10    PT Start Time 8280    PT Stop Time 1100    PT Time Calculation (min) 42 min    Activity Tolerance Patient tolerated treatment well             History reviewed. No pertinent past medical history. History reviewed. No pertinent surgical history. Patient Active Problem List   Diagnosis Date Noted   Diverticulosis 11/11/2018   Enthesopathy of left hip region 03/31/2017   Internal hemorrhoids 01/22/2017   Lower back injury, subsequent encounter 06/06/1993    PCP: Cari Caraway, MD  REFERRING PROVIDER: Garrel Ridgel, [place]  REFERRING DIAG:  M77.8 (ICD-10-CM) - Capsulitis of foot, right M76.71 (ICD-10-CM) - Peroneal tendinitis of right lower extremity  THERAPY DIAG:  Pain in right ankle and joints of right foot  Difficulty in walking, not elsewhere classified  Muscle weakness (generalized)  ONSET DATE: 2 years ago February  SUBJECTIVE:   SUBJECTIVE STATEMENT: 10/22/2021 States that he is doing well and looking to looking to join  the beach. States that he feels about 80% better and is no longer taking meloxicam.  Eval: States that he was walking the dg 2 years ago and he went down to pick up his dogs business and he rolled his right ankle. Sates that it hurt really bad. States about 1.5 weeks later he went to [facility] and had a hairline fraction. States that he wore a boot for a while. States that it got better but that the pain never went away. States that he had plantar fascitis and that got better but he continues to have lateral foot pain. States that he has a history of rolling his ankle and has always had weak ankles. States that he is now on Meloxicam which helps but if doesn't have his medication he hurts. No injections on right side, wears a compression wrap around the arch and that helps sometimes. Everting foot increases pain.    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 no: NPRS scale: 0/10 Pain location: right foot Pain description: sharp, ache at rest Aggravating factors: weightbearing Relieving factors: medications   FALLS:  Has patient fallen in last 6 months? No, Number of falls: 0  OCCUPATION: retired.  PLOF: Independent, was walking 10K steps easily and up to  15K  (currently averages about 5-6K steps/day)  PATIENT GOALS no pain in the ankle and improve overall activity   OBJECTIVE:   Balance:   SLSL L 30 seconds, R - unable loses balance   POSTURE:  Right ankle rest in about 15 degrees of hindfoot inversion  PALPATION: Tenderness to palpation along right 4th and 5th met bases, Swelling noted throughout right midfoot   Edema Measurements:  Figure 8 L 53.0cm, R 53.5cm  At Metatarsals (circumference) L 23 cm, R 23.5cm   LE Measurements Lower Extremity  Right 10/22/2021 Left 10/22/2021   PROM MMT PROM MMT  Hip Flexion      Hip Extension      Hip Abduction  3+  4-  Hip Adduction      Hip Internal rotation 45  40   Hip External rotation 45  45   Knee Flexion      Knee Extension      Ankle Dorsiflexion knee bent/straight 15/10 5 12/10 5  Ankle Plantarflexion 55  60   Ankle Inversion hindfoot 30 4+ 15 5  Ankle Eversion hind foot 0 4 2 4+   (Blank rows = not tested)  * pain   LOWER EXTREMITY SPECIAL TESTS:  SLS 30 bilateral with increased sway on Right compared to left  GAIT: Distance walked: 25 Comments: walks in excessive supination along the lateral side of his foot on the right    TODAY'S TREATMENT: 10/22/2021 Therapeutic Exercise:  Standing: 6" step ups with 7# weight in either hand -5 minutes of practice Seated:bridges reviewed HEP and answered all questions   Previous interventions:  Seated: ankle Inversion/eversion - focus on knee/hip control - x15 R, ankle mobility at forefoot- focus on eversion/pronation of forefoot, DF - focus on straight up (no inversion) x15 R 5" holds  Standing:   PATIENT EDUCATION:  Education details: on on HEP on anatomy, on progress and plan moving forward, on Aeronautical engineer Person educated: Patient Education method: Explanation, Demonstration, and Handouts Education comprehension: verbalized understanding    HOME EXERCISE PROGRAM: [id number]  ASSESSMENT:  CLINICAL IMPRESSION: 10/22/2021 Reviewed goals and patient is progressing and has met all but one goal at this time. Educated and discussed adding in different strength machines from gym. Answered all questions and patient to follow up in one month to update HEP as indicated. Will continue with current POC as tolerated.   Eval: Patient is a 66 y.o. male who was seen today for physical therapy evaluation and treatment for right foot pain.Patient presents with history of left hip pain, back pain and recent left knee injury. Bilateral  hip weakness noted and poor gait mechanics noted on right with foot excessively supinating with weight bearing. Educated patient on current condition and plan moving forward. Patient would greatly benefit from skilled PT to improve overall function and quality of life.   OBJECTIVE IMPAIRMENTS Abnormal gait, decreased activity tolerance, decreased balance, decreased endurance, decreased mobility, difficulty walking, decreased ROM, decreased strength, increased edema, improper body mechanics, and pain.   ACTIVITY LIMITATIONS community activity, yard work, and shopping.   PERSONAL FACTORS Age, Fitness, 1 comorbidity: low back pain, 1-2 comorbidities: left knee pain/injury, and 3+ comorbidities: chronic ankle sprainer on R are also affecting patient's functional outcome.    REHAB POTENTIAL: Good  CLINICAL DECISION MAKING: Stable/uncomplicated  EVALUATION COMPLEXITY: Low   GOALS: Goals reviewed with patient?  yes  SHORT TERM GOALS:  Patient will be independent in self management strategies to improve quality of life and functional  outcomes. Baseline: new program Target date: 10/30/2021 Goal status: MET  2.  Patient will report at least 50% improvement in overall symptoms and/or function to demonstrate improved functional mobility Baseline: 0% Target date: 10/30/2021 Goal status: MET  3.  Patient will be able to balance on R leg in SLS for at least 20 seconds to demonstrate improved static balance Baseline: unable Target date: 10/30/2021 Goal status: MET but with moderate sway   LONG TERM GOALS:  Patient will report at least 75% improvement in overall symptoms and/or function to demonstrate improved functional mobility Baseline: 0% Target date: 12/11/2021 Goal status: MET  2.  Patient will be able to report walking over 10,000 steps daily at least 5x in one week to return to prior level of function Baseline: <6000 steps daily regularly Target date: 12/11/2021 Goal status: MET    3.  Patient will be able to report improved awareness of right foot with functional activities Target date: 12/11/2021 Goal status: MET  4.  Patient will be able to stand on right leg in SLS for at least 30 seconds to demonstrate improved static balance Baseline: unable Target date: 12/11/2021 Goal status: MET but with moderate sway     PLAN: PT FREQUENCY: 1-2x/week for total of 12 visits  PT DURATION: 12 weeks  PLANNED INTERVENTIONS: Therapeutic exercises, Therapeutic activity, Neuromuscular re-education, Balance training, Gait training, Patient/Family education, Joint manipulation, Joint mobilization, Stair training, Canalith repositioning, Aquatic Therapy, Dry Needling, Electrical stimulation, Spinal manipulation, Cryotherapy, Moist heat, and Manual therapy  PLAN FOR NEXT SESSION: update HEP, CKC hip motions (crossover step up) focus on forefoot and hindfoot pronation, edema management, hip strengthening, purposeful walking  11:51 AM, 10/22/21 Jimmy Stewart, DPT Physical Therapy with [facility]  [phone] office

## 2021-10-24 ENCOUNTER — Encounter: Payer: 59 | Admitting: Physical Therapy

## 2021-10-29 ENCOUNTER — Encounter: Payer: 59 | Admitting: Physical Therapy

## 2021-10-31 ENCOUNTER — Encounter: Payer: 59 | Admitting: Physical Therapy

## 2021-11-26 ENCOUNTER — Encounter: Payer: 59 | Admitting: Physical Therapy

## 2021-12-04 ENCOUNTER — Encounter: Payer: 59 | Admitting: Physical Therapy

## 2023-05-26 ENCOUNTER — Encounter: Payer: Self-pay | Admitting: Gastroenterology

## 2023-07-13 ENCOUNTER — Ambulatory Visit (AMBULATORY_SURGERY_CENTER): Payer: 59

## 2023-07-13 VITALS — Ht 73.0 in | Wt 215.0 lb

## 2023-07-13 DIAGNOSIS — Z83719 Family history of colon polyps, unspecified: Secondary | ICD-10-CM

## 2023-07-13 DIAGNOSIS — Z1211 Encounter for screening for malignant neoplasm of colon: Secondary | ICD-10-CM

## 2023-07-13 MED ORDER — NA SULFATE-K SULFATE-MG SULF 17.5-3.13-1.6 GM/177ML PO SOLN
1.0000 | Freq: Once | ORAL | 0 refills | Status: AC
Start: 2023-07-13 — End: 2023-07-13

## 2023-07-13 NOTE — Progress Notes (Signed)
 Pre visit completed via phone call; Patient verified name, DOB, and address; No egg or soy allergy known to patient;  No issues known to pt with past sedation with any surgeries or procedures; Patient denies ever being told they had issues or difficulty with intubation;  No FH of Malignant Hyperthermia; Pt is not on diet pills; Pt is not on home 02;  Pt is not on blood thinners;  Pt denies issues with constipation;  No A fib or A flutter; Have any cardiac testing pending--NO Insurance verified during PV appt--- [employer] Pt can ambulate without assistance;  Pt denies use of chewing tobacco Discussed diabetic/weight loss medication holds; Discussed NSAID holds; Checked BMI to be less than 50; Pt instructed to use Single[employer] or GoodRx for a price reduction on prep;  Patient's chart reviewed by Norleen Schillings CNRA prior to previsit and patient appropriate for the [facility];  Pre visit completed and red dot placed by patient's name on their procedure day (on provider's schedule);   Instructions sent to MyChart per the patient's request;

## 2023-07-14 ENCOUNTER — Telehealth: Payer: Self-pay | Admitting: Gastroenterology

## 2023-07-14 NOTE — Telephone Encounter (Signed)
 Inbound call from patient requesting a call back to discuss prior authorization needed for 1/15 colonoscopy. Please advise, thank you.

## 2023-07-22 ENCOUNTER — Ambulatory Visit: Payer: Managed Care, Other (non HMO) | Admitting: Gastroenterology

## 2023-07-22 ENCOUNTER — Encounter: Payer: Self-pay | Admitting: Gastroenterology

## 2023-07-22 ENCOUNTER — Telehealth: Payer: Self-pay

## 2023-07-22 VITALS — BP 115/68 | HR 56 | Temp 97.3°F | Resp 15 | Ht 73.0 in | Wt 215.0 lb

## 2023-07-22 DIAGNOSIS — K5730 Diverticulosis of large intestine without perforation or abscess without bleeding: Secondary | ICD-10-CM | POA: Diagnosis not present

## 2023-07-22 DIAGNOSIS — K635 Polyp of colon: Secondary | ICD-10-CM

## 2023-07-22 DIAGNOSIS — K642 Third degree hemorrhoids: Secondary | ICD-10-CM

## 2023-07-22 DIAGNOSIS — Z1211 Encounter for screening for malignant neoplasm of colon: Secondary | ICD-10-CM

## 2023-07-22 DIAGNOSIS — D122 Benign neoplasm of ascending colon: Secondary | ICD-10-CM

## 2023-07-22 MED ORDER — SODIUM CHLORIDE 0.9 % IV SOLN: 500.0000 mL | Freq: Once | INTRAVENOUS | Status: DC

## 2023-07-22 NOTE — Telephone Encounter (Signed)
 Referral, records, demographic, and insurance information have been faxed to Dr.White/Dr. Andy Bannister at [facility] (F: [phone]).

## 2023-07-22 NOTE — Op Note (Signed)
 [facility] Patient Name: Jimmy Stewart Procedure Date: 07/22/2023 9:37 AM MRN: [id number] Endoscopist: Ace Abu L. Dominic Friendly , MD, [id number] Age: 68 Referring MD:  Date of Birth: Mar 28, 1956 Gender: Male Account #: [id number] Procedure:                Colonoscopy Indications:              Increased risk colon polyp surveillance: Personal                            history of colonic polyps. Hx colon polyps father                            and sister                           Last colonoscopy > 5 years ago by Dr Andriette Keeling                           multiple prior banding treatments of symptomatic                            internal HR Medicines:                Monitored Anesthesia Care Procedure:                Pre-Anesthesia Assessment:                           - Prior to the procedure, a History and Physical                            was performed, and patient medications and                            allergies were reviewed. The patient's tolerance of                            previous anesthesia was also reviewed. The risks                            and benefits of the procedure and the sedation                            options and risks were discussed with the patient.                            All questions were answered, and informed consent                            was obtained. Prior Anticoagulants: The patient has                            taken no anticoagulant or antiplatelet agents. ASA  Grade Assessment: II - A patient with mild systemic                            disease. After reviewing the risks and benefits,                            the patient was deemed in satisfactory condition to                            undergo the procedure.                           After obtaining informed consent, the colonoscope                            was passed under direct vision. Throughout the                            procedure, the  patient's blood pressure, pulse, and                            oxygen saturations were monitored continuously. The                            Olympus Scope SN: [id number] was introduced through                            the anus and advanced to the the terminal ileum,                            with identification of the appendiceal orifice and                            IC valve. The colonoscopy was performed without                            difficulty. The patient tolerated the procedure                            well. The quality of the bowel preparation was                            generally good after lavage except for scattered                            fibrous debris and diverticular stool balls in the                            left colon that could not be completely cleared.                            The terminal ileum, ileocecal valve, appendiceal  orifice, and rectum were photographed. Scope In: 9:50:40 AM Scope Out: 10:05:06 AM Scope Withdrawal Time: 0 hours 12 minutes 26 seconds  Total Procedure Duration: 0 hours 14 minutes 26 seconds  Findings:                 Prolapsed internal hemorrhoids were found on                            perianal exam.                           Repeat examination of right colon under NBI                            performed.                           Multiple diverticula were found in the left colon                            and right colon. L>R with associated haustral                            thickening.                           A 10mm polyp was found in the ascending colon. The                            polyp was semi-sessile and mucus-capped. The polyp                            was removed with a cold snare. Resection and                            retrieval were complete.                           Internal hemorrhoids were found. The hemorrhoids                            were large and Grade III  (internal hemorrhoids that                            prolapse but require manual reduction).                           The exam was otherwise without abnormality on                            direct and retroflexion views. (except for distal                            rectal mucosal scars above the dentate line from  prior bandings)                           The terminal ileum appeared normal. Complications:            No immediate complications. Estimated Blood Loss:     Estimated blood loss was minimal. Impression:               - Hemorrhoids found on perianal exam.                           - Diverticulosis in the left colon and in the right                            colon.                           - One 10 mm polyp in the ascending colon, removed                            with a cold snare. Resected and retrieved.                           - Internal hemorrhoids.                           - The examination was otherwise normal on direct                            and retroflexion views. Recommendation:           - Patient has a contact number available for                            emergencies. The signs and symptoms of potential                            delayed complications were discussed with the                            patient. Return to normal activities tomorrow.                            Written discharge instructions were provided to the                            patient.                           - Resume previous diet.                           - Continue present medications.                           - Await pathology results.                           -  Repeat colonoscopy in 3 years for surveillance.                            (polyp and prep - see above) Golytely prep for next                            colonoscopy.                           - Consider a referral to colorectal surgery for                            hemorrhoids that  do not appear amenable to further                            banding attempts. Nadege Carriger L. Dominic Friendly, MD 07/22/2023 10:15:08 AM This report has been signed electronically.

## 2023-07-22 NOTE — Telephone Encounter (Addendum)
-----   Message from Kerby Pearson III sent at 07/22/2023 12:22 PM EST ----- Regarding: colorectal surgery referral Jimmy Stewart,   This patient requested a referral to colorectal surgery for symptomatic internal hemorrhoids. Please send a referral to Dr Camilo Cella or Andy Bannister at [facility].  - HD

## 2023-07-22 NOTE — Progress Notes (Signed)
 Sedate, gd SR, tolerated procedure well, VSS, report to RN

## 2023-07-22 NOTE — Progress Notes (Signed)
 Called to room to assist during endoscopic procedure.  Patient ID and intended procedure confirmed with present staff. Received instructions for my participation in the procedure from the performing physician.

## 2023-07-22 NOTE — Patient Instructions (Signed)

## 2023-07-22 NOTE — Progress Notes (Signed)
 Pt's states no medical or surgical changes since previsit or office visit.

## 2023-07-22 NOTE — Progress Notes (Signed)
 History and Physical:  This patient presents for endoscopic testing for: Encounter Diagnosis  Name Primary?   Special screening for malignant neoplasms, colon Yes    Father and sibling reportedly had colon polyps. Patient denies chronic abdominal pain, rectal bleeding, constipation or diarrhea. Patient reports multiple prior colonoscopies at outside GI practices starting in his 30s, and believes he had one polyp in all those exams.  Last exam reportedly 5-6 years ago by Dr Andriette Keeling and does not think polyps were found. Intermittent HR symptoms  Patient is otherwise without complaints or active issues today.   Past Medical History: Past Medical History:  Diagnosis Date   Arthritis    bilateral hands     Past Surgical History: Past Surgical History:  Procedure Laterality Date   ANKLE SURGERY Left 1984   COLONOSCOPY  11/2018   Dr. Andriette Keeling   TONSILLECTOMY  1961    Allergies: No Known Allergies  Outpatient Meds: Current Outpatient Medications  Medication Sig Dispense Refill   Multiple Vitamin tablet Take 1 tablet by mouth daily.     psyllium (METAMUCIL) 58.6 % packet Take 1 packet by mouth daily.     tadalafil (CIALIS) 5 MG tablet Take 5 mg by mouth daily.     HYDROcodone-acetaminophen (NORCO/VICODIN) 5-325 MG tablet Take 1 tablet by mouth every 6 (six) hours as needed for moderate pain (pain score 4-6) or severe pain (pain score 7-10).     ketoconazole (NIZORAL) 2 % shampoo Apply 1 Application topically 2 (two) times a week.     naproxen sodium (ALEVE) 220 MG tablet Take 220 mg by mouth daily as needed.     Current Facility-Administered Medications  Medication Dose Route Frequency Provider Last Rate Last Admin   0.9 %  sodium chloride  infusion  500 mL Intravenous Once Danis, Roel Clarity III, MD          ___________________________________________________________________ Objective   Exam:  BP (!) 119/50   Pulse 64   Temp (!) 97.3 F (36.3 C)   Ht 6\' 1"  (1.854 m)   Wt  215 lb (97.5 kg)   SpO2 98%   BMI 28.37 kg/m   CV: regular , S1/S2 Resp: clear to auscultation bilaterally, normal RR and effort noted GI: soft, no tenderness, with active bowel sounds.   Assessment: Encounter Diagnosis  Name Primary?   Special screening for malignant neoplasms, colon Yes     Plan: Colonoscopy   The benefits and risks of the planned procedure were described in detail with the patient or (when appropriate) their health care proxy.  Risks were outlined as including, but not limited to, bleeding, infection, perforation, adverse medication reaction leading to cardiac or pulmonary decompensation, pancreatitis (if ERCP).  The limitation of incomplete mucosal visualization was also discussed.  No guarantees or warranties were given.  The patient is appropriate for an endoscopic procedure in the ambulatory setting.   - Lorella Roles, MD

## 2023-07-23 ENCOUNTER — Telehealth: Payer: Self-pay

## 2023-07-23 NOTE — Telephone Encounter (Signed)
F/u call completed. Instructed to call with any questions or concerns.

## 2023-07-24 LAB — SURGICAL PATHOLOGY

## 2023-07-28 ENCOUNTER — Encounter: Payer: Self-pay | Admitting: Gastroenterology

## 2023-07-29 ENCOUNTER — Encounter: Payer: Self-pay | Admitting: Gastroenterology

## 2023-09-09 ENCOUNTER — Ambulatory Visit: Payer: Self-pay | Admitting: Surgery

## 2023-11-11 ENCOUNTER — Other Ambulatory Visit: Payer: Self-pay | Admitting: Surgery

## 2023-11-11 DIAGNOSIS — K644 Residual hemorrhoidal skin tags: Secondary | ICD-10-CM | POA: Diagnosis not present

## 2023-11-11 DIAGNOSIS — K649 Unspecified hemorrhoids: Secondary | ICD-10-CM | POA: Diagnosis not present

## 2023-11-11 DIAGNOSIS — K643 Fourth degree hemorrhoids: Secondary | ICD-10-CM | POA: Diagnosis not present

## 2023-11-11 DIAGNOSIS — K642 Third degree hemorrhoids: Secondary | ICD-10-CM | POA: Diagnosis not present

## 2023-11-13 LAB — SURGICAL PATHOLOGY

## 2024-03-09 ENCOUNTER — Other Ambulatory Visit (HOSPITAL_BASED_OUTPATIENT_CLINIC_OR_DEPARTMENT_OTHER): Payer: Self-pay

## 2024-03-14 DIAGNOSIS — R972 Elevated prostate specific antigen [PSA]: Secondary | ICD-10-CM | POA: Diagnosis not present

## 2024-03-14 DIAGNOSIS — R7301 Impaired fasting glucose: Secondary | ICD-10-CM | POA: Diagnosis not present

## 2024-03-21 DIAGNOSIS — K080 Exfoliation of teeth due to systemic causes: Secondary | ICD-10-CM | POA: Diagnosis not present

## 2024-03-30 DIAGNOSIS — Z0000 Encounter for general adult medical examination without abnormal findings: Secondary | ICD-10-CM | POA: Diagnosis not present

## 2024-04-14 DIAGNOSIS — L570 Actinic keratosis: Secondary | ICD-10-CM | POA: Diagnosis not present

## 2024-04-14 DIAGNOSIS — L821 Other seborrheic keratosis: Secondary | ICD-10-CM | POA: Diagnosis not present

## 2024-04-14 DIAGNOSIS — D485 Neoplasm of uncertain behavior of skin: Secondary | ICD-10-CM | POA: Diagnosis not present

## 2024-04-28 DIAGNOSIS — N529 Male erectile dysfunction, unspecified: Secondary | ICD-10-CM | POA: Diagnosis not present

## 2024-06-01 DIAGNOSIS — B029 Zoster without complications: Secondary | ICD-10-CM | POA: Diagnosis not present

## 2024-06-16 DIAGNOSIS — S7011XA Contusion of right thigh, initial encounter: Secondary | ICD-10-CM | POA: Diagnosis not present
